# Patient Record
Sex: Male | Born: 1935 | Race: White | Hispanic: No | Smoking: Never smoker
Health system: Southern US, Community
[De-identification: ages and names within clinical notes are randomized; demographics above are authoritative.]

## PROBLEM LIST (undated history)

## (undated) DIAGNOSIS — K219 Gastro-esophageal reflux disease without esophagitis: Secondary | ICD-10-CM

## (undated) DIAGNOSIS — I639 Cerebral infarction, unspecified: Secondary | ICD-10-CM

## (undated) DIAGNOSIS — E785 Hyperlipidemia, unspecified: Secondary | ICD-10-CM

## (undated) DIAGNOSIS — I10 Essential (primary) hypertension: Secondary | ICD-10-CM

## (undated) DIAGNOSIS — I219 Acute myocardial infarction, unspecified: Secondary | ICD-10-CM

## (undated) DIAGNOSIS — F419 Anxiety disorder, unspecified: Secondary | ICD-10-CM

---

## 2008-04-16 ENCOUNTER — Emergency Department (HOSPITAL_BASED_OUTPATIENT_CLINIC_OR_DEPARTMENT_OTHER): Admission: EM | Admit: 2008-04-16 | Discharge: 2008-04-16 | Payer: Self-pay | Admitting: Emergency Medicine

## 2008-04-27 ENCOUNTER — Emergency Department (HOSPITAL_BASED_OUTPATIENT_CLINIC_OR_DEPARTMENT_OTHER): Admission: EM | Admit: 2008-04-27 | Discharge: 2008-04-27 | Payer: Self-pay | Admitting: Emergency Medicine

## 2008-09-24 ENCOUNTER — Emergency Department (HOSPITAL_BASED_OUTPATIENT_CLINIC_OR_DEPARTMENT_OTHER): Admission: EM | Admit: 2008-09-24 | Discharge: 2008-09-24 | Payer: Self-pay | Admitting: Emergency Medicine

## 2009-12-03 ENCOUNTER — Emergency Department (HOSPITAL_BASED_OUTPATIENT_CLINIC_OR_DEPARTMENT_OTHER): Admission: EM | Admit: 2009-12-03 | Discharge: 2009-12-03 | Payer: Self-pay | Admitting: Emergency Medicine

## 2009-12-03 ENCOUNTER — Ambulatory Visit: Payer: Self-pay | Admitting: Diagnostic Radiology

## 2009-12-04 ENCOUNTER — Emergency Department (HOSPITAL_BASED_OUTPATIENT_CLINIC_OR_DEPARTMENT_OTHER): Admission: EM | Admit: 2009-12-04 | Discharge: 2009-12-04 | Payer: Self-pay | Admitting: Emergency Medicine

## 2009-12-11 ENCOUNTER — Emergency Department (HOSPITAL_BASED_OUTPATIENT_CLINIC_OR_DEPARTMENT_OTHER): Admission: EM | Admit: 2009-12-11 | Discharge: 2009-12-11 | Payer: Self-pay | Admitting: Emergency Medicine

## 2010-01-12 ENCOUNTER — Emergency Department (HOSPITAL_BASED_OUTPATIENT_CLINIC_OR_DEPARTMENT_OTHER)
Admission: EM | Admit: 2010-01-12 | Discharge: 2010-01-12 | Payer: Self-pay | Source: Home / Self Care | Admitting: Emergency Medicine

## 2010-05-13 ENCOUNTER — Emergency Department (HOSPITAL_BASED_OUTPATIENT_CLINIC_OR_DEPARTMENT_OTHER): Admission: EM | Admit: 2010-05-13 | Discharge: 2010-05-13 | Payer: Self-pay | Admitting: Emergency Medicine

## 2010-05-13 ENCOUNTER — Ambulatory Visit: Payer: Self-pay | Admitting: Diagnostic Radiology

## 2010-05-20 ENCOUNTER — Emergency Department (HOSPITAL_BASED_OUTPATIENT_CLINIC_OR_DEPARTMENT_OTHER): Admission: EM | Admit: 2010-05-20 | Discharge: 2010-05-20 | Payer: Self-pay | Admitting: Emergency Medicine

## 2011-04-05 ENCOUNTER — Encounter: Payer: Self-pay | Admitting: *Deleted

## 2011-04-05 ENCOUNTER — Emergency Department (INDEPENDENT_AMBULATORY_CARE_PROVIDER_SITE_OTHER): Payer: Medicare Other

## 2011-04-05 ENCOUNTER — Emergency Department (HOSPITAL_BASED_OUTPATIENT_CLINIC_OR_DEPARTMENT_OTHER)
Admission: EM | Admit: 2011-04-05 | Discharge: 2011-04-05 | Disposition: A | Discharge status: Home / Self Care | Payer: Medicare Other | Attending: Emergency Medicine | Admitting: Emergency Medicine

## 2011-04-05 DIAGNOSIS — S42143A Displaced fracture of glenoid cavity of scapula, unspecified shoulder, initial encounter for closed fracture: Secondary | ICD-10-CM

## 2011-04-05 DIAGNOSIS — W050XXA Fall from non-moving wheelchair, initial encounter: Secondary | ICD-10-CM | POA: Insufficient documentation

## 2011-04-05 DIAGNOSIS — W19XXXA Unspecified fall, initial encounter: Secondary | ICD-10-CM

## 2011-04-05 DIAGNOSIS — Z8679 Personal history of other diseases of the circulatory system: Secondary | ICD-10-CM | POA: Insufficient documentation

## 2011-04-05 DIAGNOSIS — K219 Gastro-esophageal reflux disease without esophagitis: Secondary | ICD-10-CM | POA: Insufficient documentation

## 2011-04-05 DIAGNOSIS — I252 Old myocardial infarction: Secondary | ICD-10-CM | POA: Insufficient documentation

## 2011-04-05 DIAGNOSIS — F411 Generalized anxiety disorder: Secondary | ICD-10-CM | POA: Insufficient documentation

## 2011-04-05 HISTORY — DX: Cerebral infarction, unspecified: I63.9

## 2011-04-05 HISTORY — DX: Gastro-esophageal reflux disease without esophagitis: K21.9

## 2011-04-05 HISTORY — DX: Anxiety disorder, unspecified: F41.9

## 2011-04-05 HISTORY — DX: Acute myocardial infarction, unspecified: I21.9

## 2011-04-05 MED ORDER — HYDROCODONE-ACETAMINOPHEN 5-325 MG PO TABS
1.0000 | ORAL_TABLET | Freq: Once | ORAL | Status: AC
  Administered 2011-04-05: 1 via ORAL
  Filled 2011-04-05: qty 1

## 2011-04-05 MED ORDER — ACETAMINOPHEN 325 MG PO TABS: 650.0000 mg | ORAL_TABLET | Freq: Four times a day (QID) | ORAL | Status: DC | PRN

## 2011-04-05 MED ORDER — HYDROCODONE-ACETAMINOPHEN 5-325 MG PO TABS: 1.0000 | ORAL_TABLET | Freq: Four times a day (QID) | ORAL | Status: DC | PRN

## 2011-04-05 NOTE — ED Notes (Signed)
EMS reports patient slipped out of bed last night and landed on his right shoulder.  Pain last night minimal, increasing through out the day.  Pain with movement.  No LOC.

## 2011-04-07 MED ORDER — ACETAMINOPHEN 325 MG PO TABS: 650.0000 mg | ORAL_TABLET | Freq: Four times a day (QID) | ORAL | Status: AC | PRN

## 2011-04-07 MED ORDER — HYDROCODONE-ACETAMINOPHEN 5-325 MG PO TABS: 2.0000 | ORAL_TABLET | Freq: Four times a day (QID) | ORAL | Status: AC | PRN

## 2011-04-07 MED ORDER — ACETAMINOPHEN 325 MG PO TABS: 650.0000 mg | ORAL_TABLET | Freq: Four times a day (QID) | ORAL | Status: DC | PRN

## 2011-04-07 MED ORDER — HYDROCODONE-ACETAMINOPHEN 5-325 MG PO TABS: 1.0000 | ORAL_TABLET | Freq: Four times a day (QID) | ORAL | Status: AC | PRN

## 2011-04-09 NOTE — ED Provider Notes (Signed)
History     CSN: [id number] Arrival date & time: 04/05/2011  3:29 PM  Chief Complaint  Patient presents with  . Fall   HPI Comments: This was a purely mechanical fall.  PAtient did not want to see a doctor yesterday but his pain persisted today and he changed his mind.  Patient is a 77 y.o. male presenting with fall. The history is provided by the patient, the nursing home and a relative. No language interpreter was used.  Fall The accident occurred 12 to 24 hours ago. Incident: while transferring from wheelchair to bed. He fell from a height of 1 to 2 ft. He landed on carpet. There was no blood loss. The point of impact was the right shoulder. The pain is present in the right shoulder. The pain is at a severity of 1/10. He was ambulatory at the scene. There was no entrapment after the fall. There was no drug use involved in the accident. There was no alcohol use involved in the accident. The symptoms are aggravated by use of the injured limb and pressure on the injury. He has tried acetaminophen for the symptoms. The treatment provided no relief.    Past Medical History  Diagnosis Date  . Stroke   . Anxiety   . GERD (gastroesophageal reflux disease)   . MI (myocardial infarction)     History reviewed. No pertinent past surgical history.  History reviewed. No pertinent family history.  History  Substance Use Topics  . Smoking status: Never Smoker   . Smokeless tobacco: Not on file  . Alcohol Use: No      Review of Systems  Constitutional: Negative.   HENT: Negative.   Eyes: Negative.   Respiratory: Negative.   Cardiovascular: Negative.   Genitourinary: Negative.   Musculoskeletal:       Right shoulder pain  Neurological: Negative.   Hematological: Negative.   Psychiatric/Behavioral: Negative.   All other systems reviewed and are negative.    Physical Exam  BP 101/50  Pulse 82  Temp(Src) 100.1 F (37.8 C) (Oral)  Resp 18  SpO2 100%  Physical Exam  Nursing  note and vitals reviewed. Constitutional: He is oriented to person, place, and time. He appears well-developed and well-nourished. No distress.  HENT:  Head: Normocephalic and atraumatic.  Eyes: Conjunctivae and EOM are normal. Pupils are equal, round, and reactive to light.  Neck: Normal range of motion.  Cardiovascular: Normal rate, regular rhythm and normal heart sounds.  Exam reveals no gallop and no friction rub.   No murmur heard. Pulmonary/Chest: Effort normal and breath sounds normal. No respiratory distress. He has no wheezes. He has no rales.  Abdominal: Soft. He exhibits no distension. There is no tenderness. There is no rebound and no guarding.  Musculoskeletal: He exhibits edema and tenderness.       Right shoulder: He exhibits decreased range of motion, tenderness, bony tenderness and swelling.       Ecchymosis overlying anterior portion of the shoulder  Neurological: He is alert and oriented to person, place, and time. No cranial nerve deficit. He exhibits normal muscle tone. Coordination normal.  Skin: Skin is warm and dry.  Psychiatric: He has a normal mood and affect.    ED Course  Procedures  MDM Patient had a purely mechanical fall and was hemodynamically stable here.  He denied pain medicine and film was performed.  There was evidence of glenoid fracture and ortho was consulted.  We discussed plan to place patient in  sling, provide pain control, and advise him to follow-up on outpatient basis.  Patient family was a bedside.  Dr. Cornelius Moras also gave precaution that patient should be monitored for shortness of breath as this could signal a pulmonary contusion associated with the injury.  Family stated understanding and patient was discharged home in good condition in sling with pain meds, precautions, and instructions to be NWB on the RUE and to follow-up with orthopedics.  Family was concerned as patient was scheduled for R CEA on Monday and I instructed them to contact the on  call doctor for their neurosurgeon to notify them of today's events and to see whether they would like to reschedule surgery.  A: 77 yo M with fracture of the R glenoid.  P: As per above.Cyndra Numbers, MD 04/09/11 [phone]

## 2011-05-29 LAB — CBC
HCT: 35 — ABNORMAL LOW
MCV: 91.8
Platelets: 208
RBC: 3.81 — ABNORMAL LOW
RDW: 14

## 2011-05-29 LAB — DIFFERENTIAL
Eosinophils Relative: 2
Lymphocytes Relative: 18
Lymphs Abs: 1.3
Neutro Abs: 4.6

## 2011-05-29 LAB — BASIC METABOLIC PANEL
BUN: 30 — ABNORMAL HIGH
Calcium: 8.6
Potassium: 4.1
Sodium: 142

## 2011-05-29 LAB — OCCULT BLOOD X 1 CARD TO LAB, STOOL: Fecal Occult Bld: NEGATIVE

## 2012-11-21 ENCOUNTER — Encounter (HOSPITAL_COMMUNITY): Payer: Self-pay | Admitting: Emergency Medicine

## 2012-11-21 ENCOUNTER — Emergency Department (HOSPITAL_COMMUNITY)
Admission: EM | Admit: 2012-11-21 | Discharge: 2012-11-21 | Disposition: A | Discharge status: Home / Self Care | Payer: Medicare Other | Attending: Emergency Medicine | Admitting: Emergency Medicine

## 2012-11-21 DIAGNOSIS — Z7982 Long term (current) use of aspirin: Secondary | ICD-10-CM | POA: Insufficient documentation

## 2012-11-21 DIAGNOSIS — I252 Old myocardial infarction: Secondary | ICD-10-CM | POA: Insufficient documentation

## 2012-11-21 DIAGNOSIS — S01501A Unspecified open wound of lip, initial encounter: Secondary | ICD-10-CM | POA: Insufficient documentation

## 2012-11-21 DIAGNOSIS — Z7902 Long term (current) use of antithrombotics/antiplatelets: Secondary | ICD-10-CM | POA: Insufficient documentation

## 2012-11-21 DIAGNOSIS — Z8673 Personal history of transient ischemic attack (TIA), and cerebral infarction without residual deficits: Secondary | ICD-10-CM | POA: Insufficient documentation

## 2012-11-21 DIAGNOSIS — W050XXA Fall from non-moving wheelchair, initial encounter: Secondary | ICD-10-CM | POA: Insufficient documentation

## 2012-11-21 DIAGNOSIS — S0181XA Laceration without foreign body of other part of head, initial encounter: Secondary | ICD-10-CM

## 2012-11-21 DIAGNOSIS — Y939 Activity, unspecified: Secondary | ICD-10-CM | POA: Insufficient documentation

## 2012-11-21 DIAGNOSIS — F411 Generalized anxiety disorder: Secondary | ICD-10-CM | POA: Insufficient documentation

## 2012-11-21 DIAGNOSIS — Y9210 Unspecified residential institution as the place of occurrence of the external cause: Secondary | ICD-10-CM | POA: Insufficient documentation

## 2012-11-21 DIAGNOSIS — K219 Gastro-esophageal reflux disease without esophagitis: Secondary | ICD-10-CM | POA: Insufficient documentation

## 2012-11-21 DIAGNOSIS — Z79899 Other long term (current) drug therapy: Secondary | ICD-10-CM | POA: Insufficient documentation

## 2012-11-21 DIAGNOSIS — W1809XA Striking against other object with subsequent fall, initial encounter: Secondary | ICD-10-CM | POA: Insufficient documentation

## 2012-11-21 NOTE — ED Notes (Addendum)
Pt from Nursing home, c/o fall out of wheel chair. Pt states wheel chair moved. Skin tear to left forearm, laceration to bottom lip. Per EMS Pt had nosebleed on scene, has resolved. No LOC or signs of distress

## 2012-11-21 NOTE — ED Provider Notes (Signed)
LACERATION REPAIR Date/Time: 11/21/2012 9:25 AM Performed by: Dierdre Forth Authorized by: Dierdre Forth Consent: Verbal consent obtained. Risks and benefits: risks, benefits and alternatives were discussed Consent given by: patient Patient understanding: patient states understanding of the procedure being performed Patient consent: the patient's understanding of the procedure matches consent given Procedure consent: procedure consent matches procedure scheduled Relevant documents: relevant documents present and verified Site marked: the operative site was marked Required items: required blood products, implants, devices, and special equipment available Patient identity confirmed: verbally with patient and arm band Time out: Immediately prior to procedure a "time out" was called to verify the correct patient, procedure, equipment, support staff and site/side marked as required. Body area: mouth Location details: lower lip, interior Laceration length: 5 cm Foreign bodies: no foreign bodies Tendon involvement: none Nerve involvement: none Vascular damage: no Anesthesia: local infiltration Local anesthetic: lidocaine 2% without epinephrine Anesthetic total: 2.5 ml Patient sedated: no Preparation: Patient was prepped and draped in the usual sterile fashion. Irrigation solution: saline Irrigation method: syringe Amount of cleaning: standard Debridement: none Degree of undermining: none Mucous membrane closure: 4-0 Chromic gut Number of sutures: 6 Technique: simple Approximation: close Approximation difficulty: complex Patient tolerance: Patient tolerated the procedure well with no immediate complications.  LACERATION REPAIR Date/Time: 11/21/2012 9:26 AM Performed by: Dierdre Forth Authorized by: Dierdre Forth Consent: Verbal consent obtained. Risks and benefits: risks, benefits and alternatives were discussed Consent given by: patient Patient  understanding: patient states understanding of the procedure being performed Patient consent: the patient's understanding of the procedure matches consent given Procedure consent: procedure consent matches procedure scheduled Relevant documents: relevant documents present and verified Site marked: the operative site was marked Required items: required blood products, implants, devices, and special equipment available Patient identity confirmed: verbally with patient Time out: Immediately prior to procedure a "time out" was called to verify the correct patient, procedure, equipment, support staff and site/side marked as required. Body area: head/neck Location details: lower lip Full thickness lip laceration: yes Vermillion border involved: yes Lip laceration height: vermillion only Laceration length: 4.5 cm Tendon involvement: none Nerve involvement: none Vascular damage: no Anesthesia: local infiltration Local anesthetic: lidocaine 2% without epinephrine Anesthetic total: 2.5 ml Patient sedated: no Irrigation solution: saline Irrigation method: syringe Amount of cleaning: standard Debridement: none Degree of undermining: none Skin closure: 5-0 Prolene Number of sutures: 8 Technique: running Approximation: close Approximation difficulty: simple Lip approximation: vermillion border well aligned Dressing: 4x4 sterile gauze Patient tolerance: Patient tolerated the procedure well with no immediate complications.   Bobby Client [name], PA-C 11/21/12 [phone]

## 2012-11-21 NOTE — ED Provider Notes (Addendum)
History     CSN: [id number]  Arrival date & time 11/21/12  7829   First MD Initiated Contact with Patient 11/21/12 [phone]      Chief Complaint  Patient presents with  . Fall  . Lip Laceration    (Consider location/radiation/quality/duration/timing/severity/associated sxs/prior treatment) Patient is a 79 y.o. male presenting with fall. The history is provided by the patient.  Fall   patient here before for nursing home of wheelchair and striking the bottom of his lip. No loss of consciousness. Denies any head or neck pain. Does note moderate bleeding to his lower lip. Denies any chest abdomen pain. No hip or lower extremity pain. He also sustained a skin tear to his left forearm which is treated with a bandage. Symptoms have been persistent and were treated with direct pressure to his lower lip. Denies any jaw pain at this time. No trouble including his teeth  Past Medical History  Diagnosis Date  . Stroke   . Anxiety   . GERD (gastroesophageal reflux disease)   . MI (myocardial infarction)     History reviewed. No pertinent past surgical history.  History reviewed. No pertinent family history.  History  Substance Use Topics  . Smoking status: Never Smoker   . Smokeless tobacco: Not on file  . Alcohol Use: No      Review of Systems  All other systems reviewed and are negative.    Allergies  Review of patient's allergies indicates no known allergies.  Home Medications   Current Outpatient Rx  Name  Route  Sig  Dispense  Refill  . acetaminophen (TYLENOL) 325 MG tablet   Oral   Take 2 tablets (650 mg total) by mouth every 6 (six) hours as needed.   30 tablet   0   . acetaminophen (TYLENOL) 500 MG tablet   Oral   Take 1,000 mg by mouth every 6 (six) hours as needed. Pain           . aspirin 81 MG tablet   Oral   Take 81 mg by mouth every other day.          Marland Kitchen atorvastatin (LIPITOR) 40 MG tablet   Oral   Take 40 mg by mouth daily.           .  carvedilol (COREG) 3.125 MG tablet   Oral   Take 3.125 mg by mouth 2 (two) times daily with a meal.           . Cholecalciferol (VITAMIN D) 2000 UNITS CAPS   Oral   Take 1 capsule by mouth daily.           . clopidogrel (PLAVIX) 75 MG tablet   Oral   Take 75 mg by mouth daily.           Marland Kitchen lisinopril (PRINIVIL,ZESTRIL) 2.5 MG tablet   Oral   Take 2.5 mg by mouth daily.           . nitroGLYCERIN (NITROSTAT) 0.4 MG SL tablet   Sublingual   Place 0.4 mg under the tongue every 5 (five) minutes as needed.           . pantoprazole (PROTONIX) 40 MG tablet   Oral   Take 40 mg by mouth daily.           . Tamsulosin HCl (FLOMAX) 0.4 MG CAPS   Oral   Take 0.4 mg by mouth daily.  BP 137/91  Pulse 75  Temp(Src) 97.5 F (36.4 C) (Axillary)  SpO2 98%  Physical Exam  Nursing note and vitals reviewed. Constitutional: He is oriented to person, place, and time. He appears well-developed and well-nourished.  Non-toxic appearance. No distress.  HENT:  Head: Normocephalic and atraumatic.  Mouth/Throat:    No mal occlusion  Eyes: Conjunctivae, EOM and lids are normal. Pupils are equal, round, and reactive to light.  Neck: Normal range of motion. Neck supple. No spinous process tenderness and no muscular tenderness present. No tracheal deviation present. No mass present.  Cardiovascular: Normal rate, regular rhythm and normal heart sounds.  Exam reveals no gallop.   No murmur heard. Pulmonary/Chest: Effort normal and breath sounds normal. No stridor. No respiratory distress. He has no decreased breath sounds. He has no wheezes. He has no rhonchi. He has no rales.  Abdominal: Soft. Normal appearance and bowel sounds are normal. He exhibits no distension. There is no tenderness. There is no rebound and no CVA tenderness.  Musculoskeletal: Normal range of motion. He exhibits no edema and no tenderness.       Arms: Neurological: He is alert and oriented to person,  place, and time. He has normal strength. No cranial nerve deficit or sensory deficit. GCS eye subscore is 4. GCS verbal subscore is 5. GCS motor subscore is 6.  Skin: Skin is warm and dry. No abrasion and no rash noted.  Psychiatric: He has a normal mood and affect. His speech is normal and behavior is normal.    ED Course  Procedures (including critical care time)  Labs Reviewed - No data to display No results found.   No diagnosis found.    MDM  Laceration repaired by PA--no indication for imaging        Toy Baker, MD 11/21/12 1610  Toy Baker, MD 11/21/12 [phone]

## 2012-11-23 NOTE — ED Provider Notes (Signed)
Medical screening examination/treatment/procedure(s) were conducted as a shared visit with non-physician practitioner(s) and myself.  I personally evaluated the patient during the encounter  Toy Baker, MD 11/23/12 [phone]

## 2013-03-11 ENCOUNTER — Emergency Department (HOSPITAL_COMMUNITY)
Admission: EM | Admit: 2013-03-11 | Discharge: 2013-03-11 | Disposition: A | Discharge status: Home / Self Care | Payer: Medicare Other | Attending: Emergency Medicine | Admitting: Emergency Medicine

## 2013-03-11 ENCOUNTER — Encounter (HOSPITAL_COMMUNITY): Payer: Self-pay | Admitting: Emergency Medicine

## 2013-03-11 DIAGNOSIS — S41111A Laceration without foreign body of right upper arm, initial encounter: Secondary | ICD-10-CM

## 2013-03-11 DIAGNOSIS — Z8673 Personal history of transient ischemic attack (TIA), and cerebral infarction without residual deficits: Secondary | ICD-10-CM | POA: Insufficient documentation

## 2013-03-11 DIAGNOSIS — K219 Gastro-esophageal reflux disease without esophagitis: Secondary | ICD-10-CM | POA: Insufficient documentation

## 2013-03-11 DIAGNOSIS — Z7902 Long term (current) use of antithrombotics/antiplatelets: Secondary | ICD-10-CM | POA: Insufficient documentation

## 2013-03-11 DIAGNOSIS — Y9210 Unspecified residential institution as the place of occurrence of the external cause: Secondary | ICD-10-CM | POA: Insufficient documentation

## 2013-03-11 DIAGNOSIS — S41109A Unspecified open wound of unspecified upper arm, initial encounter: Secondary | ICD-10-CM | POA: Insufficient documentation

## 2013-03-11 DIAGNOSIS — F0390 Unspecified dementia without behavioral disturbance: Secondary | ICD-10-CM | POA: Insufficient documentation

## 2013-03-11 DIAGNOSIS — Z79899 Other long term (current) drug therapy: Secondary | ICD-10-CM | POA: Insufficient documentation

## 2013-03-11 DIAGNOSIS — W010XXA Fall on same level from slipping, tripping and stumbling without subsequent striking against object, initial encounter: Secondary | ICD-10-CM | POA: Insufficient documentation

## 2013-03-11 DIAGNOSIS — Y9389 Activity, other specified: Secondary | ICD-10-CM | POA: Insufficient documentation

## 2013-03-11 DIAGNOSIS — Z23 Encounter for immunization: Secondary | ICD-10-CM | POA: Insufficient documentation

## 2013-03-11 DIAGNOSIS — I252 Old myocardial infarction: Secondary | ICD-10-CM | POA: Insufficient documentation

## 2013-03-11 DIAGNOSIS — Z8659 Personal history of other mental and behavioral disorders: Secondary | ICD-10-CM | POA: Insufficient documentation

## 2013-03-11 MED ORDER — TETANUS-DIPHTH-ACELL PERTUSSIS 5-2.5-18.5 LF-MCG/0.5 IM SUSP
0.5000 mL | Freq: Once | INTRAMUSCULAR | Status: AC
  Administered 2013-03-11: 0.5 mL via INTRAMUSCULAR
  Filled 2013-03-11: qty 0.5

## 2013-03-11 NOTE — ED Notes (Signed)
ptar called to transport pt.

## 2013-03-11 NOTE — ED Notes (Signed)
Report given to Nurse on duty at [facility]

## 2013-03-11 NOTE — ED Notes (Signed)
Per EMS: From [facility]. pt had unwitnessed fall, found on floor. Did not hit head, two skin tears to right arm. Denies pain and any other complaints.

## 2013-03-11 NOTE — ED Provider Notes (Signed)
History    CSN: [id number] Arrival date & time 03/11/13  1847  First MD Initiated Contact with Patient 03/11/13 1858     Chief Complaint  Patient presents with  . Fall   (Consider location/radiation/quality/duration/timing/severity/associated sxs/prior Treatment) Patient is a 79 y.o. male presenting with fall. The history is provided by the patient and the EMS personnel. The history is limited by the condition of the patient.  Fall Pertinent negatives include no chest pain, no abdominal pain and no headaches.  pt s[facility]p fall at [facility]. States trip and fall, denies faintness or loc. Denies head injury or headache. No neck or back pain. Skin tears right upper arm, pt unsure of tetanus.  pts mental status described as being c/w baseline. Hx dementia -- level 5 caveat.     Past Medical History  Diagnosis Date  . Stroke   . Anxiety   . GERD (gastroesophageal reflux disease)   . MI (myocardial infarction)    History reviewed. No pertinent past surgical history. No family history on file. History  Substance Use Topics  . Smoking status: Never Smoker   . Smokeless tobacco: Not on file  . Alcohol Use: No    Review of Systems  Unable to perform ROS: Dementia  Constitutional: Negative for fever.  Cardiovascular: Negative for chest pain.  Gastrointestinal: Negative for abdominal pain.  Neurological: Negative for headaches.  level 5 caveat    Allergies  Review of patient's allergies indicates no known allergies.  Home Medications   Current Outpatient Rx  Name  Route  Sig  Dispense  Refill  . acetaminophen (TYLENOL) 500 MG tablet   Oral   Take 1,000 mg by mouth every 6 (six) hours as needed. Pain           . aluminum-magnesium hydroxide 200-200 MG/5ML suspension   Oral   Take 30 mLs by mouth every 6 (six) hours as needed for indigestion.         . ARTIFICIAL TEAR OP   Ophthalmic   Apply 1 drop to eye 4 (four) times daily as needed. For dry eyes         .  atorvastatin (LIPITOR) 40 MG tablet   Oral   Take 40 mg by mouth daily.           . carvedilol (COREG) 3.125 MG tablet   Oral   Take 3.125 mg by mouth 2 (two) times daily with a meal.           . clopidogrel (PLAVIX) 75 MG tablet   Oral   Take 75 mg by mouth daily.           . GuaiFENesin (IOPHEN-NR PO)   Oral   Take 10 mLs by mouth every 6 (six) hours as needed. For cough         . loperamide (IMODIUM) 2 MG capsule   Oral   Take 2 mg by mouth 4 (four) times daily as needed for diarrhea or loose stools.         . Magnesium Hydroxide (MILK OF MAGNESIA PO)   Oral   Take 30 mLs by mouth at bedtime as needed. For constipation         . Nepafenac (ILEVRO) 0.3 % SUSP   Ophthalmic   Apply 1 drop to eye daily.         . nitroGLYCERIN (NITROSTAT) 0.4 MG SL tablet   Sublingual   Place 0.4 mg under the tongue every 5 (five) minutes  as needed.           . pantoprazole (PROTONIX) 40 MG tablet   Oral   Take 40 mg by mouth daily.           Marland Kitchen PRESCRIPTION MEDICATION   Left Eye   Place 1 drop into the left eye 3 (three) times daily. Uses Duzerol eye drops         . Tamsulosin HCl (FLOMAX) 0.4 MG CAPS   Oral   Take 0.4 mg by mouth daily.           BP 120/56  Pulse 83  Temp(Src) 98.4 F (36.9 C) (Oral)  Resp 21  SpO2 99% Physical Exam  Nursing note and vitals reviewed. Constitutional: He appears well-developed and well-nourished. No distress.  HENT:  Head: Atraumatic.  No facial or scalp sts or tenderness.   Eyes: Pupils are equal, round, and reactive to light.  Neck: Normal range of motion. Neck supple. No tracheal deviation present.  Cardiovascular: Normal rate.   Pulmonary/Chest: Effort normal. No accessory muscle usage. No respiratory distress. He exhibits no tenderness.  Abdominal: Soft. He exhibits no distension. There is no tenderness.  Musculoskeletal: Normal range of motion. He exhibits no edema.  Superficial small skin tears to right upper  arm. Good rom bil extremities without pain or focal bony tenderness. Distal pulses palp. CTLS spine, non tender, aligned, no step off.   Neurological: He is alert.  Alert, smiling, content. Cooperative. Mental status described at baseline. Motor intact bil.   Skin: Skin is warm and dry.  Psychiatric: He has a normal mood and affect.    ED Course  Procedures (including critical care time)   MDM  Tetanus unknown. Tet im.  Spine nt.   Superficial skin tear to right upper arm, cleaned. Sterile dressing.  No focal bony tenderness.  Pt denies pain. Mental status at baseline.  Pt appears stable for d/c.     Suzi Roots, MD 03/11/13 [phone]

## 2013-03-11 NOTE — ED Notes (Signed)
[id number]<WG> Expected date:<BR> Expected time:<BR> Means of arrival:<BR> Comments:<BR> ems- 79 yo F fall

## 2013-05-31 ENCOUNTER — Emergency Department (HOSPITAL_COMMUNITY): Payer: Medicare Other

## 2013-05-31 ENCOUNTER — Emergency Department (HOSPITAL_COMMUNITY)
Admission: EM | Admit: 2013-05-31 | Discharge: 2013-05-31 | Disposition: A | Discharge status: Home / Self Care | Payer: Medicare Other | Attending: Emergency Medicine | Admitting: Emergency Medicine

## 2013-05-31 ENCOUNTER — Encounter (HOSPITAL_COMMUNITY): Payer: Self-pay

## 2013-05-31 DIAGNOSIS — Z8639 Personal history of other endocrine, nutritional and metabolic disease: Secondary | ICD-10-CM | POA: Insufficient documentation

## 2013-05-31 DIAGNOSIS — Z79899 Other long term (current) drug therapy: Secondary | ICD-10-CM | POA: Insufficient documentation

## 2013-05-31 DIAGNOSIS — Z7902 Long term (current) use of antithrombotics/antiplatelets: Secondary | ICD-10-CM | POA: Insufficient documentation

## 2013-05-31 DIAGNOSIS — Z8673 Personal history of transient ischemic attack (TIA), and cerebral infarction without residual deficits: Secondary | ICD-10-CM | POA: Insufficient documentation

## 2013-05-31 DIAGNOSIS — S0990XA Unspecified injury of head, initial encounter: Secondary | ICD-10-CM | POA: Insufficient documentation

## 2013-05-31 DIAGNOSIS — Y9210 Unspecified residential institution as the place of occurrence of the external cause: Secondary | ICD-10-CM | POA: Insufficient documentation

## 2013-05-31 DIAGNOSIS — R296 Repeated falls: Secondary | ICD-10-CM | POA: Insufficient documentation

## 2013-05-31 DIAGNOSIS — Z8659 Personal history of other mental and behavioral disorders: Secondary | ICD-10-CM | POA: Insufficient documentation

## 2013-05-31 DIAGNOSIS — I252 Old myocardial infarction: Secondary | ICD-10-CM | POA: Insufficient documentation

## 2013-05-31 DIAGNOSIS — I10 Essential (primary) hypertension: Secondary | ICD-10-CM | POA: Insufficient documentation

## 2013-05-31 DIAGNOSIS — W1809XA Striking against other object with subsequent fall, initial encounter: Secondary | ICD-10-CM | POA: Insufficient documentation

## 2013-05-31 DIAGNOSIS — K219 Gastro-esophageal reflux disease without esophagitis: Secondary | ICD-10-CM | POA: Insufficient documentation

## 2013-05-31 DIAGNOSIS — W19XXXA Unspecified fall, initial encounter: Secondary | ICD-10-CM

## 2013-05-31 DIAGNOSIS — Y9389 Activity, other specified: Secondary | ICD-10-CM | POA: Insufficient documentation

## 2013-05-31 DIAGNOSIS — Z862 Personal history of diseases of the blood and blood-forming organs and certain disorders involving the immune mechanism: Secondary | ICD-10-CM | POA: Insufficient documentation

## 2013-05-31 DIAGNOSIS — F0390 Unspecified dementia without behavioral disturbance: Secondary | ICD-10-CM | POA: Insufficient documentation

## 2013-05-31 HISTORY — DX: Hyperlipidemia, unspecified: E78.5

## 2013-05-31 HISTORY — DX: Essential (primary) hypertension: I10

## 2013-05-31 NOTE — ED Notes (Signed)
Son and Dr. Judd Lien at the bedside at this time.

## 2013-05-31 NOTE — ED Notes (Signed)
Per [facility], pt from [facility] for fall this morning. Takes plavix and fell when transferring from commode to other chair. Denies any LOC but did hit his head. Pt alert to self which is not normal. Pt is own POA and also DNR. LBBB on monitor. VSS

## 2013-05-31 NOTE — ED Provider Notes (Addendum)
CSN: [id number]     Arrival date & time 05/31/13  1015 History   First MD Initiated Contact with Patient 05/31/13 1028     Chief Complaint  Patient presents with  . Fall   (Consider location/radiation/quality/duration/timing/severity/associated sxs/prior Treatment) HPI Comments: Patient is a 79 year old male past medical history of dementia. Is a resident of house extended care facility. He was sent here after a fall that occurred this morning while transferring from the toilet to wheelchair. Patient has no complaints and states that nothing hurts. The staff at the [facility] reports that his head. As the patient is on Plavix and has baseline confusion he was sent here for further evaluation.  Patient is a 79 y.o. male presenting with fall. The history is provided by the patient.  Fall This is a new problem. The current episode started less than 1 hour ago. The problem occurs constantly. The problem has not changed since onset.Associated symptoms comments: None. Nothing aggravates the symptoms. Nothing relieves the symptoms. He has tried nothing for the symptoms. The treatment provided no relief.    Past Medical History  Diagnosis Date  . Stroke   . Anxiety   . GERD (gastroesophageal reflux disease)   . MI (myocardial infarction)   . Hypertension   . Hyperlipidemia    History reviewed. No pertinent past surgical history. History reviewed. No pertinent family history. History  Substance Use Topics  . Smoking status: Never Smoker   . Smokeless tobacco: Not on file  . Alcohol Use: No    Review of Systems  All other systems reviewed and are negative.    Allergies  Review of patient's allergies indicates no known allergies.  Home Medications   Current Outpatient Rx  Name  Route  Sig  Dispense  Refill  . acetaminophen (TYLENOL) 500 MG tablet   Oral   Take 1,000 mg by mouth every 6 (six) hours as needed. Pain           . aluminum-magnesium hydroxide 200-200 MG/5ML suspension  Oral   Take 30 mLs by mouth every 6 (six) hours as needed for indigestion.         . ARTIFICIAL TEAR OP   Ophthalmic   Apply 1 drop to eye 4 (four) times daily as needed. For dry eyes         . carvedilol (COREG) 3.125 MG tablet   Oral   Take 3.125 mg by mouth 2 (two) times daily with a meal.           . clopidogrel (PLAVIX) 75 MG tablet   Oral   Take 75 mg by mouth daily.           Marland Kitchen loperamide (IMODIUM) 2 MG capsule   Oral   Take 2 mg by mouth 4 (four) times daily as needed for diarrhea or loose stools.         . Magnesium Hydroxide (MILK OF MAGNESIA PO)   Oral   Take 30 mLs by mouth at bedtime as needed. For constipation         . nitroGLYCERIN (NITROSTAT) 0.4 MG SL tablet   Sublingual   Place 0.4 mg under the tongue every 5 (five) minutes as needed.           . pantoprazole (PROTONIX) 40 MG tablet   Oral   Take 40 mg by mouth daily.           Marland Kitchen PRESCRIPTION MEDICATION   Left Eye   Place 1 drop  into the left eye 3 (three) times daily. Uses Duzerol eye drops         . Tamsulosin HCl (FLOMAX) 0.4 MG CAPS   Oral   Take 0.4 mg by mouth daily.           BP 110/63  Pulse 70  Temp(Src) 97.6 F (36.4 C) (Oral)  Resp 20  SpO2 97% Physical Exam  Nursing note and vitals reviewed. Constitutional: He appears well-developed and well-nourished. No distress.  HENT:  Head: Normocephalic and atraumatic.  Mouth/Throat: Oropharynx is clear and moist.  Eyes: EOM are normal. Pupils are equal, round, and reactive to light.  Neck: Normal range of motion. Neck supple.  Cardiovascular: Normal rate, regular rhythm and normal heart sounds.   No murmur heard. Pulmonary/Chest: Effort normal and breath sounds normal. No respiratory distress. He has no wheezes.  Abdominal: Soft. Bowel sounds are normal.  Neurological: He is alert. No cranial nerve deficit. He exhibits normal muscle tone. Coordination normal.  Patient is alert and oriented to person and situation  however he is unsure of the month and year. He is otherwise smiling and pleasant and appropriate.  Skin: He is not diaphoretic.    ED Course  Procedures (including critical care time) Labs Review Labs Reviewed - No data to display Imaging Review No results found.   Date: 05/31/2013  Rate: 81  Rhythm: normal sinus rhythm with pac's  QRS Axis: left  Intervals: normal  ST/T Wave abnormalities: nonspecific T wave changes  Conduction Disutrbances:left bundle branch block  Narrative Interpretation:   Old EKG Reviewed: none available    MDM  No diagnosis found. Patient was sent here from the extended care facility for evaluation after a fall. The nurses there stated he hit his head the patient is on Plavix. There was some question as to whether or not he was more confused. According to the son the patient is at his baseline. After discussion with him, the decision was made to obtain a CT of the head just to rule out any intracranial hemorrhage. This was performed and was negative. Patient remained stable and is at his baseline per her son. I feel as though he is stable for discharge. To return when necessary if he develops any new or bothersome symptoms.    Geoffery Lyons, MD 05/31/13 1326  Geoffery Lyons, MD 05/31/13 [phone]

## 2013-07-07 ENCOUNTER — Emergency Department (HOSPITAL_COMMUNITY): Payer: Medicare Other

## 2013-07-07 ENCOUNTER — Emergency Department (HOSPITAL_COMMUNITY)
Admission: EM | Admit: 2013-07-07 | Discharge: 2013-07-07 | Disposition: A | Discharge status: Skilled Nursing Facility | Payer: Medicare Other | Attending: Emergency Medicine | Admitting: Emergency Medicine

## 2013-07-07 DIAGNOSIS — S0180XA Unspecified open wound of other part of head, initial encounter: Secondary | ICD-10-CM | POA: Insufficient documentation

## 2013-07-07 DIAGNOSIS — Z7902 Long term (current) use of antithrombotics/antiplatelets: Secondary | ICD-10-CM | POA: Insufficient documentation

## 2013-07-07 DIAGNOSIS — Y9389 Activity, other specified: Secondary | ICD-10-CM | POA: Insufficient documentation

## 2013-07-07 DIAGNOSIS — I252 Old myocardial infarction: Secondary | ICD-10-CM | POA: Insufficient documentation

## 2013-07-07 DIAGNOSIS — S0990XA Unspecified injury of head, initial encounter: Secondary | ICD-10-CM

## 2013-07-07 DIAGNOSIS — Y9210 Unspecified residential institution as the place of occurrence of the external cause: Secondary | ICD-10-CM | POA: Insufficient documentation

## 2013-07-07 DIAGNOSIS — I10 Essential (primary) hypertension: Secondary | ICD-10-CM | POA: Insufficient documentation

## 2013-07-07 DIAGNOSIS — Z8673 Personal history of transient ischemic attack (TIA), and cerebral infarction without residual deficits: Secondary | ICD-10-CM | POA: Insufficient documentation

## 2013-07-07 DIAGNOSIS — K219 Gastro-esophageal reflux disease without esophagitis: Secondary | ICD-10-CM | POA: Insufficient documentation

## 2013-07-07 DIAGNOSIS — W050XXA Fall from non-moving wheelchair, initial encounter: Secondary | ICD-10-CM | POA: Insufficient documentation

## 2013-07-07 DIAGNOSIS — Z79899 Other long term (current) drug therapy: Secondary | ICD-10-CM | POA: Insufficient documentation

## 2013-07-07 DIAGNOSIS — Z8659 Personal history of other mental and behavioral disorders: Secondary | ICD-10-CM | POA: Insufficient documentation

## 2013-07-07 DIAGNOSIS — S01111A Laceration without foreign body of right eyelid and periocular area, initial encounter: Secondary | ICD-10-CM

## 2013-07-07 DIAGNOSIS — E785 Hyperlipidemia, unspecified: Secondary | ICD-10-CM | POA: Insufficient documentation

## 2013-07-07 DIAGNOSIS — F0390 Unspecified dementia without behavioral disturbance: Secondary | ICD-10-CM | POA: Insufficient documentation

## 2013-07-07 DIAGNOSIS — Z7982 Long term (current) use of aspirin: Secondary | ICD-10-CM | POA: Insufficient documentation

## 2013-07-07 NOTE — ED Notes (Signed)
PTAR called  

## 2013-07-07 NOTE — ED Notes (Signed)
Suture cart to bedside. 

## 2013-07-07 NOTE — ED Notes (Signed)
Patient transported to CT 

## 2013-07-07 NOTE — ED Notes (Signed)
To ED via [facility] from [facility] c/o fell while transferring from w/c to bed- 3" laceration over right eyebrow. Ecchymotic area over left eye -- old injury per staff. Pleasantly confused. Alert.

## 2013-07-07 NOTE — ED Provider Notes (Signed)
CSN: [id number]     Arrival date & time 07/07/13  1343 History   First MD Initiated Contact with Patient 07/07/13 1346     Chief Complaint  Patient presents with  . Fall  . Head Laceration   (Consider location/radiation/quality/duration/timing/severity/associated sxs/prior Treatment) HPI Comments: Patient is a 79 year old male past medical history of dementia. Is a resident of house extended care facility. He was sent here after a fall that occurred this morning while transferring from the wheelchair to bed. Patient has no complaints and states that nothing hurts. The staff at the [facility] reports that he hit his head. As the patient is on Plavix and has baseline confusion he was sent here for further evaluation.  The history is provided by the patient. No language interpreter was used.    Past Medical History  Diagnosis Date  . Stroke   . Anxiety   . GERD (gastroesophageal reflux disease)   . MI (myocardial infarction)   . Hypertension   . Hyperlipidemia    No past surgical history on file. No family history on file. History  Substance Use Topics  . Smoking status: Never Smoker   . Smokeless tobacco: Not on file  . Alcohol Use: No    Review of Systems  All other systems reviewed and are negative.    Allergies  Review of patient's allergies indicates no known allergies.  Home Medications   Current Outpatient Rx  Name  Route  Sig  Dispense  Refill  . acetaminophen (TYLENOL) 500 MG tablet   Oral   Take 500-1,000 mg by mouth every 4 (four) hours as needed for mild pain or fever. Pain          . alum & mag hydroxide-simeth (MAALOX/MYLANTA) 200-200-20 MG/5ML suspension   Oral   Take 30 mLs by mouth 4 (four) times daily as needed for indigestion or heartburn.         Marland Kitchen aspirin 81 MG chewable tablet   Oral   Chew 81 mg by mouth daily.         . carvedilol (COREG) 3.125 MG tablet   Oral   Take 3.125 mg by mouth 2 (two) times daily with a meal.           .  clopidogrel (PLAVIX) 75 MG tablet   Oral   Take 75 mg by mouth daily.           Marland Kitchen guaiFENesin (ROBITUSSIN) 100 MG/5ML liquid   Oral   Take 200 mg by mouth every 6 (six) hours as needed for cough.         . loperamide (IMODIUM) 2 MG capsule   Oral   Take 2 mg by mouth 4 (four) times daily as needed for diarrhea or loose stools.         . Magnesium Hydroxide (MILK OF MAGNESIA PO)   Oral   Take 30 mLs by mouth at bedtime as needed. For constipation         . nitroGLYCERIN (NITROSTAT) 0.4 MG SL tablet   Sublingual   Place 0.4 mg under the tongue every 5 (five) minutes as needed.           . pantoprazole (PROTONIX) 40 MG tablet   Oral   Take 40 mg by mouth daily.           . Tamsulosin HCl (FLOMAX) 0.4 MG CAPS   Oral   Take 0.4 mg by mouth daily.  BP 120/59  Pulse 64  Temp(Src) 97.6 F (36.4 C) (Oral)  Resp 18  SpO2 97% Physical Exam  Nursing note and vitals reviewed. Constitutional: He is oriented to person, place, and time. He appears well-developed and well-nourished.  HENT:  Head: Normocephalic and atraumatic.  Right Ear: External ear normal.  Left Ear: External ear normal.  Nose: Nose normal.  Mouth/Throat: Oropharynx is clear and moist. No oropharyngeal exudate.  No palpable bony deformity  Eyes: Conjunctivae and EOM are normal. Pupils are equal, round, and reactive to light. Right eye exhibits no discharge. Left eye exhibits no discharge. No scleral icterus.  Neck: Normal range of motion. Neck supple. No JVD present.  Cardiovascular: Normal rate, regular rhythm, normal heart sounds and intact distal pulses.  Exam reveals no gallop and no friction rub.   No murmur heard. Pulmonary/Chest: Effort normal and breath sounds normal. No respiratory distress. He has no wheezes. He has no rales. He exhibits no tenderness.  Abdominal: Soft. Bowel sounds are normal. He exhibits no distension and no mass. There is no tenderness. There is no rebound and no  guarding.  Musculoskeletal: Normal range of motion. He exhibits no edema and no tenderness.  CTLS spine non-tender to palpation, no step offs, or gross abnormality or deformity  Neurological: He is alert and oriented to person, place, and time. He has normal reflexes.  CN 3-12 intact  Skin: Skin is warm and dry.  5 cm laceration to right eyebrow, no obvious foreign bodies  Psychiatric: He has a normal mood and affect. His behavior is normal. Judgment and thought content normal.    ED Course  Procedures (including critical care time)   LACERATION REPAIR Performed by: Roxy Horseman Authorized by: Roxy Horseman Consent: Verbal consent obtained. Risks and benefits: risks, benefits and alternatives were discussed Consent given by: patient Patient identity confirmed: provided demographic data Prepped and Draped in normal sterile fashion Wound explored  Laceration Location: right forehead  Laceration Length: 5 cm  No Foreign Bodies seen or palpated  Anesthesia: local infiltration  Local anesthetic: lidocaine 2% with epinephrine  Anesthetic total: 3 ml  Irrigation method: syringe Amount of cleaning: standard  Skin closure: 5-0 prolene  Number of sutures: 9  Technique: running  Patient tolerance: Patient tolerated the procedure well with no immediate complications.   EKG Interpretation   None       MDM   1. Head injury, initial encounter   2. Eyebrow laceration, right, initial encounter      Patient seen by and discussed with Dr. Wilkie Aye.  Will check CT and cervical c-spine.  Will repair lac.  If imaging is negative, will discharge to home.  Laceration repaired.    CT shows evidence of pleural effusion, but not clinically relevant.  He is afebrile.  His pulse ox is 99%.  No difficulty breathing.  States that he wants to go home.  Discussed this with Dr. Wilkie Aye, who tells me that we can discharge the patient.  Roxy Horseman, PA-C 07/07/13 1642

## 2013-07-07 NOTE — ED Notes (Signed)
Pt placed on continuous pulse oximetry and blood pressure cuff 

## 2013-07-07 NOTE — ED Notes (Signed)
Pt return from CT. MD at bedside

## 2013-07-08 NOTE — ED Provider Notes (Signed)
Medical screening examination/treatment/procedure(s) were conducted as a shared visit with non-physician practitioner(s) and myself.  I personally evaluated the patient during the encounter.  EKG Interpretation   None      This is a 79 year old male who presents with a laceration to the for headache. She sustained a mechanical fall while transferring. He is currently on Plavix. At baseline he has dementia and per EMS report the staff at the facility report that he is at his baseline. Patient is a poor historian. Obvious evidence of head trauma laceration. Bleeding controlled at this time. Head CT negative for acute bleed. Incidentally, patient noted to have bilateral pleural effusions on head CT. Patient is nonambulatory and denies shortness of breath. Given that he is in no respiratory distress and has no complaints otherwise, we'll not further evaluate these findings. Patient will be discharged back to facility.  After history, exam, and medical workup I feel the patient has been appropriately medically screened and is safe for discharge home. Pertinent diagnoses were discussed with the patient. Patient was given return precautions.   Shon Baton, MD 07/08/13 [id number]

## 2014-08-28 ENCOUNTER — Emergency Department (HOSPITAL_COMMUNITY): Payer: Medicare Other

## 2014-08-28 ENCOUNTER — Encounter (HOSPITAL_COMMUNITY): Payer: Self-pay

## 2014-08-28 ENCOUNTER — Emergency Department (HOSPITAL_COMMUNITY)
Admission: EM | Admit: 2014-08-28 | Discharge: 2014-08-28 | Disposition: A | Discharge status: Home / Self Care | Payer: Medicare Other | Attending: Emergency Medicine | Admitting: Emergency Medicine

## 2014-08-28 DIAGNOSIS — K219 Gastro-esophageal reflux disease without esophagitis: Secondary | ICD-10-CM | POA: Diagnosis not present

## 2014-08-28 DIAGNOSIS — S0083XA Contusion of other part of head, initial encounter: Secondary | ICD-10-CM | POA: Diagnosis not present

## 2014-08-28 DIAGNOSIS — I252 Old myocardial infarction: Secondary | ICD-10-CM | POA: Insufficient documentation

## 2014-08-28 DIAGNOSIS — Z7982 Long term (current) use of aspirin: Secondary | ICD-10-CM | POA: Diagnosis not present

## 2014-08-28 DIAGNOSIS — W050XXA Fall from non-moving wheelchair, initial encounter: Secondary | ICD-10-CM | POA: Diagnosis not present

## 2014-08-28 DIAGNOSIS — Y998 Other external cause status: Secondary | ICD-10-CM | POA: Diagnosis not present

## 2014-08-28 DIAGNOSIS — Y9289 Other specified places as the place of occurrence of the external cause: Secondary | ICD-10-CM | POA: Diagnosis not present

## 2014-08-28 DIAGNOSIS — Z8673 Personal history of transient ischemic attack (TIA), and cerebral infarction without residual deficits: Secondary | ICD-10-CM | POA: Insufficient documentation

## 2014-08-28 DIAGNOSIS — Y9389 Activity, other specified: Secondary | ICD-10-CM | POA: Diagnosis not present

## 2014-08-28 DIAGNOSIS — W228XXA Striking against or struck by other objects, initial encounter: Secondary | ICD-10-CM | POA: Insufficient documentation

## 2014-08-28 DIAGNOSIS — Z8639 Personal history of other endocrine, nutritional and metabolic disease: Secondary | ICD-10-CM | POA: Diagnosis not present

## 2014-08-28 DIAGNOSIS — Z79899 Other long term (current) drug therapy: Secondary | ICD-10-CM | POA: Insufficient documentation

## 2014-08-28 DIAGNOSIS — I10 Essential (primary) hypertension: Secondary | ICD-10-CM | POA: Insufficient documentation

## 2014-08-28 DIAGNOSIS — W19XXXA Unspecified fall, initial encounter: Secondary | ICD-10-CM

## 2014-08-28 DIAGNOSIS — Z8659 Personal history of other mental and behavioral disorders: Secondary | ICD-10-CM | POA: Diagnosis not present

## 2014-08-28 DIAGNOSIS — S0990XA Unspecified injury of head, initial encounter: Secondary | ICD-10-CM | POA: Diagnosis present

## 2014-08-28 NOTE — ED Notes (Signed)
Notified [facility] for transportation back home 

## 2014-08-28 NOTE — Discharge Instructions (Signed)
Contusion °A contusion is a deep bruise. Contusions are the result of an injury that caused bleeding under the skin. The contusion may turn blue, purple, or yellow. Minor injuries will give you a painless contusion, but more severe contusions may stay painful and swollen for a few weeks.  °CAUSES  °A contusion is usually caused by a blow, trauma, or direct force to an area of the body. °SYMPTOMS  °· Swelling and redness of the injured area. °· Bruising of the injured area. °· Tenderness and soreness of the injured area. °· Pain. °DIAGNOSIS  °The diagnosis can be made by taking a history and physical exam. An X-ray, CT scan, or MRI may be needed to determine if there were any associated injuries, such as fractures. °TREATMENT  °Specific treatment will depend on what area of the body was injured. In general, the best treatment for a contusion is resting, icing, elevating, and applying cold compresses to the injured area. Over-the-counter medicines may also be recommended for pain control. Ask your caregiver what the best treatment is for your contusion. °HOME CARE INSTRUCTIONS  °· Put ice on the injured area. °¨ Put ice in a plastic bag. °¨ Place a towel between your skin and the bag. °¨ Leave the ice on for 15-20 minutes, 3-4 times a day, or as directed by your health care provider. °· Only take over-the-counter or prescription medicines for pain, discomfort, or fever as directed by your caregiver. Your caregiver may recommend avoiding anti-inflammatory medicines (aspirin, ibuprofen, and naproxen) for 48 hours because these medicines may increase bruising. °· Rest the injured area. °· If possible, elevate the injured area to reduce swelling. °SEEK IMMEDIATE MEDICAL CARE IF:  °· You have increased bruising or swelling. °· You have pain that is getting worse. °· Your swelling or pain is not relieved with medicines. °MAKE SURE YOU:  °· Understand these instructions. °· Will watch your condition. °· Will get help right  away if you are not doing well or get worse. °Document Released: 05/22/2005 Document Revised: 08/17/2013 Document Reviewed: 06/17/2011 °[employer]® Patient Information ©2015 [employer]. This information is not intended to replace advice given to you by your health care provider. Make sure you discuss any questions you have with your health care provider. ° °

## 2014-08-28 NOTE — ED Notes (Addendum)
Pt from [facility].  Pt fell from his wheelchair today striking head on bottom of wheelchair and tile floor.  No LOC.  Pt on Plavix.  Pt fell on Friday as well.  Kerlix to R elbow for skin tear from that fall.  EMS reports that staff at facility state pt always moans and cries.  Pt wants the C-Collar off and is c/o ear pain.  Denies any other pain.  Pt is oriented to name only.  EMS reports this is pt's baseline.

## 2014-08-28 NOTE — ED Notes (Signed)
Patient transported to CT 

## 2014-08-28 NOTE — ED Provider Notes (Signed)
CSN: [id number]     Arrival date & time 08/28/14  1030 History   First MD Initiated Contact with Patient 08/28/14 1049     Chief Complaint  Patient presents with  . Fall     (Consider location/radiation/quality/duration/timing/severity/associated sxs/prior Treatment) HPI Comments: Patient is on Plavix  Patient is a 81 y.o. male presenting with fall. The history is provided by the patient, the nursing home and the EMS personnel. The history is limited by the absence of a caregiver.  Fall This is a new (pt was sitting in his wheelchair and slid out to the floor hitting the left side of his face on the wheelchair) problem. The current episode started less than 1 hour ago. The problem occurs constantly. The problem has not changed since onset.Pertinent negatives include no chest pain, no abdominal pain, no headaches and no shortness of breath. Associated symptoms comments: No neck pain. Nothing aggravates the symptoms. Nothing relieves the symptoms. Treatments tried: Immobilization.    Past Medical History  Diagnosis Date  . Stroke   . Anxiety   . GERD (gastroesophageal reflux disease)   . MI (myocardial infarction)   . Hypertension   . Hyperlipidemia    History reviewed. No pertinent past surgical history. No family history on file. History  Substance Use Topics  . Smoking status: Never Smoker   . Smokeless tobacco: Not on file  . Alcohol Use: No    Review of Systems  Unable to perform ROS Respiratory: Negative for shortness of breath.   Cardiovascular: Negative for chest pain.  Gastrointestinal: Negative for abdominal pain.  Neurological: Negative for headaches.      Allergies  Review of patient's allergies indicates no known allergies.  Home Medications   Prior to Admission medications   Medication Sig Start Date End Date Taking? Authorizing Provider  acetaminophen (TYLENOL) 500 MG tablet Take 500 mg by mouth every 4 (four) hours as needed for mild pain or fever.  Pain    Yes Historical Provider, MD  alum & mag hydroxide-simeth (MAALOX/MYLANTA) 200-200-20 MG/5ML suspension Take 30 mLs by mouth 4 (four) times daily as needed for indigestion or heartburn.   Yes Historical Provider, MD  aspirin 81 MG chewable tablet Chew 81 mg by mouth daily.   Yes Historical Provider, MD  carvedilol (COREG) 3.125 MG tablet Take 3.125 mg by mouth 2 (two) times daily with a meal.     Yes Historical Provider, MD  clopidogrel (PLAVIX) 75 MG tablet Take 75 mg by mouth daily.     Yes Historical Provider, MD  guaiFENesin (ROBITUSSIN) 100 MG/5ML liquid Take 200 mg by mouth every 6 (six) hours as needed for cough.   Yes Historical Provider, MD  loperamide (IMODIUM) 2 MG capsule Take 2 mg by mouth 4 (four) times daily as needed for diarrhea or loose stools.   Yes Historical Provider, MD  Magnesium Hydroxide (MILK OF MAGNESIA PO) Take 30 mLs by mouth at bedtime as needed. For constipation   Yes Historical Provider, MD  nitroGLYCERIN (NITROSTAT) 0.4 MG SL tablet Place 0.4 mg under the tongue every 5 (five) minutes as needed for chest pain.    Yes Historical Provider, MD  pantoprazole (PROTONIX) 40 MG tablet Take 40 mg by mouth daily.     Yes Historical Provider, MD  Tamsulosin HCl (FLOMAX) 0.4 MG CAPS Take 0.4 mg by mouth daily.    Yes Historical Provider, MD   BP 132/55 mmHg  Pulse 71  Temp(Src) 97.6 F (36.4 C) (Oral)  Resp  18  SpO2 96% Physical Exam  Constitutional: He appears well-developed and well-nourished.  Patient is very distressed while the c-collar was on however when palpated he had no neck pain and when c-collar was removed he was comfortable, speaking and expressing no area of pain  HENT:  Head: Normocephalic. Head is with contusion.    Eyes: EOM are normal. Pupils are equal, round, and reactive to light.  Neck: Normal range of motion. Neck supple. No spinous process tenderness and no muscular tenderness present.  Cardiovascular: Normal rate, normal heart sounds  and intact distal pulses.   Pulmonary/Chest: Effort normal. No respiratory distress.  Abdominal: Soft. He exhibits no distension. There is no tenderness.  Musculoskeletal:       Right hip: Normal.       Left hip: Normal.  Neurological: He is alert.  Skin: Skin is warm and dry.  Psychiatric: He has a normal mood and affect. His behavior is normal.  Nursing note and vitals reviewed.   ED Course  Procedures (including critical care time) Labs Review Labs Reviewed - No data to display  Imaging Review Ct Head Wo Contrast  08/28/2014   CLINICAL DATA:  Initial encounter for patient fall. Chronic anticoagulation.  EXAM: CT HEAD WITHOUT CONTRAST  CT CERVICAL SPINE WITHOUT CONTRAST  TECHNIQUE: Multidetector CT imaging of the head and cervical spine was performed following the standard protocol without intravenous contrast. Multiplanar CT image reconstructions of the cervical spine were also generated.  COMPARISON:  07/07/2013  FINDINGS: CT HEAD FINDINGS  There is no evidence for acute hemorrhage, hydrocephalus, mass lesion, or abnormal extra-axial fluid collection. No definite CT evidence for acute infarction. Diffuse loss of parenchymal volume is consistent with atrophy. Patchy low attenuation in the deep hemispheric and periventricular white matter is nonspecific, but likely reflects chronic microvascular ischemic demyelination. Stable appearance of old high left parietal infarct with underlying ex vacuo dilatation of the left lateral ventricle.  The visualized paranasal sinuses and mastoid air cells are clear. Stable opacification of a few scattered mastoid air cells. No evidence for skull fracture  CT CERVICAL SPINE FINDINGS  Imaging was obtained from the skullbase through the T2 vertebral body. No evidence for fracture. Trace anterolisthesis of C4 on 5 and C5 on 6 is stable and compatible with the degree of facet disease at these levels. There is diffuse facet osteoarthritis bilaterally. Loss of disc  height at C4-5 and C6-7 is stable. Normal cervical lordosis is preserved. No evidence for prevertebral soft tissue edema.  IMPRESSION: 1. Stable CT evaluation of the brain. 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 No new or progressive findings. 2. Diffuse degenerative changes in the cervical spine without fracture.   Electronically Signed   By: Kennith Center M.D.   On: 08/28/2014 11:57   Ct Cervical Spine Wo Contrast  08/28/2014   CLINICAL DATA:  Initial encounter for patient fall. Chronic anticoagulation.  EXAM: CT HEAD WITHOUT CONTRAST  CT CERVICAL SPINE WITHOUT CONTRAST  TECHNIQUE: Multidetector CT imaging of the head and cervical spine was performed following the standard protocol without intravenous contrast. Multiplanar CT image reconstructions of the cervical spine were also generated.  COMPARISON:  07/07/2013  FINDINGS: CT HEAD FINDINGS  There is no evidence for acute hemorrhage, hydrocephalus, mass lesion, or abnormal extra-axial fluid collection. No definite CT evidence for acute infarction. Diffuse loss of parenchymal volume is consistent with atrophy. Patchy low attenuation in the deep hemispheric and periventricular  white matter is nonspecific, but likely reflects chronic microvascular ischemic demyelination. Stable appearance of old high left parietal infarct with underlying ex vacuo dilatation of the left lateral ventricle.  The visualized paranasal sinuses and mastoid air cells are clear. Stable opacification of a few scattered mastoid air cells. No evidence for skull fracture  CT CERVICAL SPINE FINDINGS  Imaging was obtained from the skullbase through the T2 vertebral body. No evidence for fracture. Trace anterolisthesis of C4 on 5 and C5 on 6 is stable and compatible with the degree of facet disease at these levels. There is diffuse facet osteoarthritis bilaterally. Loss of disc height at C4-5 and C6-7 is stable. Normal  cervical lordosis is preserved. No evidence for prevertebral soft tissue edema.  IMPRESSION: 1. Stable CT evaluation of the brain. 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 No new or progressive findings. 2. Diffuse degenerative changes in the cervical spine without fracture.   Electronically Signed   By: Kennith Center M.D.   On: 08/28/2014 11:57     EKG Interpretation None      MDM   Final diagnoses:  Fall    Patient with a mechanical fall out of the wheelchair today where the left side of his for head hit the wheelchair bottom. No LOC and patient is at his baseline per facility. Patient does take Plavix. He has no C-spine tenderness is able to range his neck. It was arranged bilateral hips without pain and has no trauma to the trunk.  CT of the head and C-spine pending.  12:02 PM CT's neg and pt d/ced home.    Gwyneth Sprout, MD 08/28/14 1202

## 2014-09-19 ENCOUNTER — Emergency Department (HOSPITAL_COMMUNITY)
Admission: EM | Admit: 2014-09-19 | Discharge: 2014-09-19 | Disposition: A | Discharge status: Home / Self Care | Payer: Medicare Other | Attending: Emergency Medicine | Admitting: Emergency Medicine

## 2014-09-19 ENCOUNTER — Emergency Department (HOSPITAL_COMMUNITY): Payer: Medicare Other

## 2014-09-19 ENCOUNTER — Encounter (HOSPITAL_COMMUNITY): Payer: Self-pay | Admitting: *Deleted

## 2014-09-19 DIAGNOSIS — Y998 Other external cause status: Secondary | ICD-10-CM | POA: Diagnosis not present

## 2014-09-19 DIAGNOSIS — I252 Old myocardial infarction: Secondary | ICD-10-CM | POA: Insufficient documentation

## 2014-09-19 DIAGNOSIS — Y9289 Other specified places as the place of occurrence of the external cause: Secondary | ICD-10-CM | POA: Diagnosis not present

## 2014-09-19 DIAGNOSIS — Z7902 Long term (current) use of antithrombotics/antiplatelets: Secondary | ICD-10-CM | POA: Diagnosis not present

## 2014-09-19 DIAGNOSIS — Y9389 Activity, other specified: Secondary | ICD-10-CM | POA: Insufficient documentation

## 2014-09-19 DIAGNOSIS — K219 Gastro-esophageal reflux disease without esophagitis: Secondary | ICD-10-CM | POA: Diagnosis not present

## 2014-09-19 DIAGNOSIS — F419 Anxiety disorder, unspecified: Secondary | ICD-10-CM | POA: Diagnosis not present

## 2014-09-19 DIAGNOSIS — E782 Mixed hyperlipidemia: Secondary | ICD-10-CM | POA: Diagnosis not present

## 2014-09-19 DIAGNOSIS — S51011A Laceration without foreign body of right elbow, initial encounter: Secondary | ICD-10-CM

## 2014-09-19 DIAGNOSIS — Z79899 Other long term (current) drug therapy: Secondary | ICD-10-CM | POA: Diagnosis not present

## 2014-09-19 DIAGNOSIS — S59901A Unspecified injury of right elbow, initial encounter: Secondary | ICD-10-CM | POA: Diagnosis present

## 2014-09-19 DIAGNOSIS — S5001XA Contusion of right elbow, initial encounter: Secondary | ICD-10-CM | POA: Diagnosis not present

## 2014-09-19 DIAGNOSIS — Z8673 Personal history of transient ischemic attack (TIA), and cerebral infarction without residual deficits: Secondary | ICD-10-CM | POA: Insufficient documentation

## 2014-09-19 DIAGNOSIS — W19XXXA Unspecified fall, initial encounter: Secondary | ICD-10-CM

## 2014-09-19 MED ORDER — TETANUS-DIPHTH-ACELL PERTUSSIS 5-2.5-18.5 LF-MCG/0.5 IM SUSP
0.5000 mL | Freq: Once | INTRAMUSCULAR | Status: AC
  Administered 2014-09-19: 0.5 mL via INTRAMUSCULAR
  Filled 2014-09-19: qty 0.5

## 2014-09-19 NOTE — ED Notes (Signed)
Pt back from x-ray.

## 2014-09-19 NOTE — ED Notes (Signed)
[facility] here to transport pt back to [facility]. 

## 2014-09-19 NOTE — ED Notes (Signed)
[facility] called for transport.  

## 2014-09-19 NOTE — Discharge Instructions (Signed)
Return to the ED with any concerns including increased pain or swelling of elbow, redness around wound, pus draining, vomiting, seizure activity, decreased level of alertness/lethargy, or any other alarming symptoms

## 2014-09-19 NOTE — ED Notes (Signed)
[facility] Expected date:  Expected time:  Means of arrival:  Comments: EMS- shoulder pain, previously seen 3xs for same

## 2014-09-19 NOTE — ED Notes (Signed)
rn called and gave report to facility,went over discharge paperwork with rn at facility. waiting for [facility] to transport pt back.

## 2014-09-19 NOTE — ED Notes (Signed)
[facility] was called to follow-up on pt's transportation---- staff stated pt is still in queue.

## 2014-09-19 NOTE — ED Provider Notes (Signed)
CSN: [id number]     Arrival date & time 09/19/14  1415 History   First MD Initiated Contact with Patient 09/19/14 1511     Chief Complaint  Patient presents with  . Fall     (Consider location/radiation/quality/duration/timing/severity/associated sxs/prior Treatment) HPI  A LEVEL 5 CAVEAT PERTAINS DUE TO APHASIA Pt presenting from [facility] after fall from his wheelchair.  Per report, pt slid out of his wheelchair.  Fall was not witnessed by staff.  Unsure whether he hit his head.  Pt denies pain.  He has skin tear of right elbow.  He takes plavix.  He has hx of stroke, right sided deficits and aphasia per report.  Pt grimaces when moving right arm- he appears weak in this arm- may be due to prior stroke, but he does seem to have pain with palpation and movement of right upper extremity.   Past Medical History  Diagnosis Date  . Stroke   . Anxiety   . GERD (gastroesophageal reflux disease)   . MI (myocardial infarction)   . Hypertension   . Hyperlipidemia    History reviewed. No pertinent past surgical history. History reviewed. No pertinent family history. History  Substance Use Topics  . Smoking status: Never Smoker   . Smokeless tobacco: Not on file  . Alcohol Use: No    Review of Systems  UNABLE TO OBTAIN ROS DUE TO LEVEL 5 CAVEAT    Allergies  Review of patient's allergies indicates no known allergies.  Home Medications   Prior to Admission medications   Medication Sig Start Date End Date Taking? Authorizing Provider  acetaminophen (TYLENOL) 500 MG tablet Take 500 mg by mouth every 4 (four) hours as needed for mild pain, fever or headache.    Yes Historical Provider, MD  alum & mag hydroxide-simeth (MAALOX/MYLANTA) 200-200-20 MG/5ML suspension Take 30 mLs by mouth 4 (four) times daily as needed for indigestion or heartburn.   Yes Historical Provider, MD  aspirin 81 MG chewable tablet Chew 81 mg by mouth daily.   Yes Historical Provider, MD  carvedilol (COREG)  3.125 MG tablet Take 3.125 mg by mouth 2 (two) times daily with a meal.     Yes Historical Provider, MD  clopidogrel (PLAVIX) 75 MG tablet Take 75 mg by mouth daily.     Yes Historical Provider, MD  docusate sodium (COLACE) 100 MG capsule Take 100 mg by mouth 2 (two) times daily.   Yes Historical Provider, MD  guaiFENesin (ROBITUSSIN) 100 MG/5ML liquid Take 200 mg by mouth every 6 (six) hours as needed for cough.   Yes Historical Provider, MD  loperamide (IMODIUM) 2 MG capsule Take 2 mg by mouth 4 (four) times daily as needed for diarrhea or loose stools.   Yes Historical Provider, MD  Magnesium Hydroxide (MILK OF MAGNESIA PO) Take 30 mLs by mouth at bedtime as needed. For constipation   Yes Historical Provider, MD  neomycin-bacitracin-polymyxin (NEOSPORIN) ointment Apply 1 application topically daily as needed for wound care. Apply to right and left elbow wounds and cover with dressing   Yes Historical Provider, MD  nitroGLYCERIN (NITROSTAT) 0.4 MG SL tablet Place 0.4 mg under the tongue every 5 (five) minutes as needed for chest pain.    Yes Historical Provider, MD  pantoprazole (PROTONIX) 40 MG tablet Take 40 mg by mouth daily.     Yes Historical Provider, MD  polyethylene glycol (MIRALAX / GLYCOLAX) packet Take 17 g by mouth daily with breakfast.   Yes Historical Provider,  MD  Tamsulosin HCl (FLOMAX) 0.4 MG CAPS Take 0.4 mg by mouth daily.    Yes Historical Provider, MD  white petrolatum (VASELINE) GEL Apply 1 application topically as needed (to red areas on buttocks with each incontinent change).   Yes Historical Provider, MD   BP 146/62 mmHg  Pulse 87  Temp(Src) 98.2 F (36.8 C) (Oral)  Resp 18  SpO2 95%  Vitals reviewed Physical Exam  Physical Examination: General appearance - alert, well appearing, and in no distress Mental status - alert, oriented to person, place, and time, but with some aphasia Head- NCAT Eyes - pupils equal and reactive, extraocular eye movements intact Neck - no  midline tenderness Chest - clear to auscultation, no wheezes, rales or rhonchi, symmetric air entry Heart - normal rate, regular rhythm, normal S1, S2, no murmurs, rubs, clicks or gallops Abdomen - soft, nontender, nondistended, no masses or organomegaly Back exam - full range of motion, no tenderness, palpable spasm or pain on motion Neurological - alert, some aphasia, moving all extremities, right upper extremity with some flaccid paralysis Musculoskeletal -ttp over right shoulder, right elbow, right wrist, otherwise  no joint tenderness, deformity or swelling Extremities - peripheral pulses normal, no pedal edema, no clubbing or cyanosis Skin - normal coloration and turgor, no rashes, small dime sized skin tear over right elbow  ED Course  Procedures (including critical care time) Labs Review Labs Reviewed - No data to display  Imaging Review Dg Shoulder Right  09/19/2014   CLINICAL DATA:  Fall from wheelchair today. Right shoulder pain. Initial encounter.  EXAM: RIGHT SHOULDER - 2+ VIEW  COMPARISON:  Right shoulder radiographs 04/05/2011  FINDINGS: Advanced degenerative changes are again noted at the glenohumeral joint. The shoulder is located. No acute abnormality is present. The right hemithorax is clear. The clavicle is intact.  IMPRESSION: 1. Advanced degenerative changes of the right shoulder. 2. No acute abnormality.   Electronically Signed   By: Gennette Pac M.D.   On: 09/19/2014 16:20   Dg Elbow Complete Right  09/19/2014   CLINICAL DATA:  RIGHT elbow pain.  Fall from wheelchair.  EXAM: RIGHT ELBOW - COMPLETE 3+ VIEW  COMPARISON:  04/05/2011.  FINDINGS: There is no evidence of fracture, dislocation, or joint effusion. There is no evidence of arthropathy or other focal bone abnormality. Soft tissues are unremarkable.  IMPRESSION: Negative.   Electronically Signed   By: Andreas Newport M.D.   On: 09/19/2014 16:22   Dg Wrist Complete Right  09/19/2014   CLINICAL DATA:  Fall from  wheelchair today. Right wrist pain. Initial encounter.  EXAM: RIGHT WRIST - COMPLETE 3+ VIEW  COMPARISON:  None.  FINDINGS: Moderate osteopenia is present. The wrist is located. No acute fracture is present. There is some soft tissue swelling about the wrist. Vascular calcifications are noted as well.  IMPRESSION: 1. No acute fracture or dislocation. 2. Soft tissue swelling about the wrist, particularly along the volar aspect. 3. Moderate osteopenia. 4. Small vessel vascular calcifications, commonly seen in the setting of diabetes.   Electronically Signed   By: Gennette Pac M.D.   On: 09/19/2014 16:22   Ct Head Wo Contrast  09/19/2014   CLINICAL DATA:  Status post fall out of a wheelchair today. Initial encounter.  EXAM: CT HEAD WITHOUT CONTRAST  TECHNIQUE: Contiguous axial images were obtained from the base of the skull through the vertex without intravenous contrast.  COMPARISON:  Head CT scan 08/28/2014 and 07/07/2013.  FINDINGS: Atrophy, chronic microvascular  ischemic change and a remote left parietal lobe infarct are again seen. There is no evidence of acute intracranial abnormality including hemorrhage, infarct, mass lesion, and mass effect, midline shift or abnormal extra-axial fluid collection. No hydrocephalus or pneumocephalus. The calvarium is intact.  IMPRESSION: No acute finding.  Stable compared to prior exams.   Electronically Signed   By: Drusilla Kannerhomas  Dalessio M.D.   On: 09/19/2014 16:00     EKG Interpretation None      MDM   Final diagnoses:  Fall  Skin tear of right elbow without complication, initial encounter    Pt presenting with c/o fall/slipping out of wheelchair.  Pt with small skin tear over right elbow.  He has some right sided weakness.  No significant evidence of trauma- xrays are reassuring.  CT head obtained due to unwitnessed fall and patient on plavix, but this was reassuring as well.  Discharged with strict return precautions.  Pt agreeable with plan.   Ethelda ChickMartha K  Linker, MD 09/19/14 [phone]

## 2014-09-19 NOTE — Progress Notes (Signed)
CSW met with patient at bedside. There was no family present. Patient confirms he is from [facility]. Per chart, patient presents to the ED due to slipping out of his wheelchair. However when asked, patient does not remember what caused fall. Patient does state that his roommate witnessed fall.   Willette Brace [phone] ED CSW 09/19/2014 5:24 PM

## 2014-09-19 NOTE — ED Notes (Addendum)
Per ems pt is from [facility], called out for fall, pt was in wheelchair, pt slipped out of wheelchair. Staff did not witness, but roommate did witness fall. Roommate is alert and oriented x4, did not see pt hit his head. Mark above left eye is chronic. Pt denies pain.  Hx of stroke, hx right sided deficits, and aphasia. Pt is fidgety. Skin tear size of a dime on right elbow, pt on blood thinners. Pt will not right arm, unsure if pt has acute injury to right shoulder.

## 2014-09-19 NOTE — ED Notes (Signed)
Patient dressed and ready for transport. Patient has on dry brief.

## 2014-09-19 NOTE — ED Notes (Signed)
Pt still waiting for ptar to arrive to transport back to facility.

## 2014-09-19 NOTE — ED Notes (Signed)
Pt to xray

## 2014-09-19 NOTE — ED Notes (Signed)
Bed: [id number] Expected date:  Expected time:  Means of arrival:  Comments: EMS- elderly, fall, elbow injury

## 2014-10-31 ENCOUNTER — Encounter (HOSPITAL_COMMUNITY): Payer: Self-pay

## 2014-10-31 ENCOUNTER — Emergency Department (HOSPITAL_COMMUNITY)

## 2014-10-31 ENCOUNTER — Emergency Department (HOSPITAL_COMMUNITY)
Admission: EM | Admit: 2014-10-31 | Discharge: 2014-10-31 | Disposition: A | Discharge status: Home / Self Care | Attending: Emergency Medicine | Admitting: Emergency Medicine

## 2014-10-31 ENCOUNTER — Other Ambulatory Visit (HOSPITAL_COMMUNITY): Payer: Self-pay

## 2014-10-31 DIAGNOSIS — Z8659 Personal history of other mental and behavioral disorders: Secondary | ICD-10-CM | POA: Insufficient documentation

## 2014-10-31 DIAGNOSIS — R531 Weakness: Secondary | ICD-10-CM | POA: Insufficient documentation

## 2014-10-31 DIAGNOSIS — I10 Essential (primary) hypertension: Secondary | ICD-10-CM | POA: Insufficient documentation

## 2014-10-31 DIAGNOSIS — Z7902 Long term (current) use of antithrombotics/antiplatelets: Secondary | ICD-10-CM | POA: Diagnosis not present

## 2014-10-31 DIAGNOSIS — Z7982 Long term (current) use of aspirin: Secondary | ICD-10-CM | POA: Insufficient documentation

## 2014-10-31 DIAGNOSIS — Z8639 Personal history of other endocrine, nutritional and metabolic disease: Secondary | ICD-10-CM | POA: Diagnosis not present

## 2014-10-31 DIAGNOSIS — R4182 Altered mental status, unspecified: Secondary | ICD-10-CM | POA: Diagnosis present

## 2014-10-31 DIAGNOSIS — I639 Cerebral infarction, unspecified: Secondary | ICD-10-CM | POA: Diagnosis not present

## 2014-10-31 DIAGNOSIS — K219 Gastro-esophageal reflux disease without esophagitis: Secondary | ICD-10-CM | POA: Diagnosis not present

## 2014-10-31 DIAGNOSIS — Z79899 Other long term (current) drug therapy: Secondary | ICD-10-CM | POA: Insufficient documentation

## 2014-10-31 DIAGNOSIS — R410 Disorientation, unspecified: Secondary | ICD-10-CM | POA: Diagnosis not present

## 2014-10-31 DIAGNOSIS — I252 Old myocardial infarction: Secondary | ICD-10-CM | POA: Insufficient documentation

## 2014-10-31 DIAGNOSIS — Z8673 Personal history of transient ischemic attack (TIA), and cerebral infarction without residual deficits: Secondary | ICD-10-CM | POA: Insufficient documentation

## 2014-10-31 LAB — COMPREHENSIVE METABOLIC PANEL
ALT: 10 U/L (ref 0–53)
AST: 26 U/L (ref 0–37)
Albumin: 3.2 g/dL — ABNORMAL LOW (ref 3.5–5.2)
Alkaline Phosphatase: 60 U/L (ref 39–117)
Anion gap: 6 (ref 5–15)
BILIRUBIN TOTAL: 0.7 mg/dL (ref 0.3–1.2)
BUN: 31 mg/dL — ABNORMAL HIGH (ref 6–23)
CHLORIDE: 107 mmol/L (ref 96–112)
CO2: 25 mmol/L (ref 19–32)
Calcium: 8.4 mg/dL (ref 8.4–10.5)
Creatinine, Ser: 1.54 mg/dL — ABNORMAL HIGH (ref 0.50–1.35)
GFR calc Af Amer: 43 mL/min — ABNORMAL LOW (ref >90)
GFR, EST NON AFRICAN AMERICAN: 37 mL/min — AB (ref >90)
Glucose, Bld: 113 mg/dL — ABNORMAL HIGH (ref 70–99)
Potassium: 4.6 mmol/L (ref 3.5–5.1)
SODIUM: 138 mmol/L (ref 135–145)
Total Protein: 6.6 g/dL (ref 6.0–8.3)

## 2014-10-31 LAB — CBC WITH DIFFERENTIAL/PLATELET
BASOS PCT: 0 % (ref 0–1)
Basophils Absolute: 0 10*3/uL (ref 0.0–0.1)
EOS ABS: 0 10*3/uL (ref 0.0–0.7)
EOS PCT: 1 % (ref 0–5)
HCT: 39 % (ref 39.0–52.0)
Hemoglobin: 12.8 g/dL — ABNORMAL LOW (ref 13.0–17.0)
Lymphocytes Relative: 10 % — ABNORMAL LOW (ref 12–46)
Lymphs Abs: 0.7 10*3/uL (ref 0.7–4.0)
MCH: 31.1 pg (ref 26.0–34.0)
MCHC: 32.8 g/dL (ref 30.0–36.0)
MCV: 94.7 fL (ref 78.0–100.0)
MONOS PCT: 10 % (ref 3–12)
Monocytes Absolute: 0.7 10*3/uL (ref 0.1–1.0)
NEUTROS PCT: 79 % — AB (ref 43–77)
Neutro Abs: 5.6 10*3/uL (ref 1.7–7.7)
PLATELETS: 159 10*3/uL (ref 150–400)
RBC: 4.12 MIL/uL — AB (ref 4.22–5.81)
RDW: 14 % (ref 11.5–15.5)
WBC: 7.1 10*3/uL (ref 4.0–10.5)

## 2014-10-31 LAB — URINE MICROSCOPIC-ADD ON

## 2014-10-31 LAB — URINALYSIS, ROUTINE W REFLEX MICROSCOPIC
BILIRUBIN URINE: NEGATIVE
Glucose, UA: NEGATIVE mg/dL
Ketones, ur: NEGATIVE mg/dL
Leukocytes, UA: NEGATIVE
NITRITE: NEGATIVE
PH: 6 (ref 5.0–8.0)
Protein, ur: NEGATIVE mg/dL
SPECIFIC GRAVITY, URINE: 1.019 (ref 1.005–1.030)
Urobilinogen, UA: 1 mg/dL (ref 0.0–1.0)

## 2014-10-31 LAB — I-STAT TROPONIN, ED: Troponin i, poc: 0.03 ng/mL (ref 0.00–0.08)

## 2014-10-31 NOTE — ED Notes (Signed)
Pt family updated on plan of care, son refusing SLP evaluation, states pt is at his baseline and was able to eat and drink without any issues for him.  MD updated and aware.

## 2014-10-31 NOTE — Consult Note (Signed)
Referring Physician: Silverio Lay    Chief Complaint: Abnormal speech and difficulty swallowing  HPI:                                                                                                                                         Bobby Boyd is an 81 y.o. male who lives at [facility]. Pt is usually able to talk and answer questions. Speech is usually not 100 % clear but is clear enough you can understand the words. Staff called for a sick call. Pt was sitting in the wheelchair in the common room and leaning to the left side. Staff noticed he was leaning to the left during second shift yesterday and speech wasn't normal. CT head obtained in ED shows old left parietal lobe infarct but no acute infarct.  Neurology was consulted for possible Stroke.   Date last known well: Unable to determine Time last known well: Unable to determine tPA Given: No: out of window Modified Rankin: Rankin Score=3    Past Medical History  Diagnosis Date  . Stroke   . Anxiety   . GERD (gastroesophageal reflux disease)   . MI (myocardial infarction)   . Hypertension   . Hyperlipidemia     History reviewed. No pertinent past surgical history.  No family history on file. Social History:  reports that he has never smoked. He does not have any smokeless tobacco history on file. He reports that he does not drink alcohol or use illicit drugs. Family history: unable to determine due to mental status and family unavailable.  Allergies: No Known Allergies  Medications:                                                                                                                           No current facility-administered medications for this encounter.   Current Outpatient Prescriptions  Medication Sig Dispense Refill  . acetaminophen (TYLENOL) 500 MG tablet Take 500 mg by mouth every 4 (four) hours as needed for mild pain, fever or headache.     Marland Kitchen alum & mag hydroxide-simeth  (MAALOX/MYLANTA) 200-200-20 MG/5ML suspension Take 30 mLs by mouth 4 (four) times daily as needed for indigestion or heartburn.    Marland Kitchen aspirin 81 MG chewable tablet Chew 81 mg by mouth at bedtime.     . carvedilol (COREG) 3.125 MG tablet Take 3.125  mg by mouth 2 (two) times daily with a meal.      . clopidogrel (PLAVIX) 75 MG tablet Take 75 mg by mouth daily.      . Cranberry 200 MG CAPS Take 200 mg by mouth 2 (two) times daily.    Marland Kitchen docusate sodium (COLACE) 100 MG capsule Take 100 mg by mouth 2 (two) times daily.    Marland Kitchen guaiFENesin (ROBITUSSIN) 100 MG/5ML liquid Take 200 mg by mouth every 6 (six) hours as needed for cough.    . loperamide (IMODIUM) 2 MG capsule Take 2 mg by mouth 4 (four) times daily as needed for diarrhea or loose stools.    . Magnesium Hydroxide (MILK OF MAGNESIA PO) Take 30 mLs by mouth at bedtime as needed. For constipation    . neomycin-bacitracin-polymyxin (NEOSPORIN) ointment Apply 1 application topically daily as needed for wound care. Apply to right and left elbow wounds and cover with dressing    . nitroGLYCERIN (NITROSTAT) 0.4 MG SL tablet Place 0.4 mg under the tongue every 5 (five) minutes as needed for chest pain.     . pantoprazole (PROTONIX) 40 MG tablet Take 40 mg by mouth daily.      . polyethylene glycol (MIRALAX / GLYCOLAX) packet Take 17 g by mouth daily with breakfast.    . Tamsulosin HCl (FLOMAX) 0.4 MG CAPS Take 0.4 mg by mouth daily.     . white petrolatum (VASELINE) GEL Apply 1 application topically as needed (to red areas on buttocks with each incontinent change).      ROS:                                                                                                                                       History obtained from the patient  General ROS: negative for - chills, fatigue, fever, night sweats, weight gain or weight loss Psychological ROS: negative for - behavioral disorder, hallucinations, memory difficulties, mood swings or suicidal  ideation Ophthalmic ROS: negative for - blurry vision, double vision, eye pain or loss of vision ENT ROS: negative for - epistaxis, nasal discharge, oral lesions, sore throat, tinnitus or vertigo Allergy and Immunology ROS: negative for - hives or itchy/watery eyes Hematological and Lymphatic ROS: negative for - bleeding problems, bruising or swollen lymph nodes Endocrine ROS: negative for - galactorrhea, hair pattern changes, polydipsia/polyuria or temperature intolerance Respiratory ROS: negative for - cough, hemoptysis, shortness of breath or wheezing Cardiovascular ROS: negative for - chest pain, dyspnea on exertion, edema or irregular heartbeat Gastrointestinal ROS: negative for - abdominal pain, diarrhea, hematemesis, nausea/vomiting or stool incontinence Genito-Urinary ROS: negative for - dysuria, hematuria, incontinence or urinary frequency/urgency Musculoskeletal ROS: negative for - joint swelling or muscular weakness Neurological ROS: as noted in HPI Dermatological ROS: negative for rash and skin lesion changes  Physical Examination:  Blood pressure 128/66, pulse 69, temperature 98.1 F (36.7 C), temperature source Oral, resp. rate 16, SpO2 100 %.  HEENT-  Normocephalic, no lesions, without obvious abnormality.  Normal external eye and conjunctiva.  Normal TM's bilaterally.  Normal auditory canals and external ears. Normal external nose, mucus membranes and septum.  Normal pharynx. Cardiovascular- regular rate and rhythm, pulses palpable throughout   Lungs- chest clear, no wheezing, rales, normal symmetric air entry, Abdomen- normal findings: bowel sounds normal Extremities- no edema Lymph-no adenopathy palpable Musculoskeletal-no joint tenderness, deformity or swelling Skin-warm and dry, no hyperpigmentation, vitiligo, or suspicious lesions  Neurological Examination Mental  Status: Alert, oriented.  Speech slightly slurred but this is stated to be baseline. No evidence of aphasia.  Able to follow simple step commands without difficulty. Cranial Nerves: II: Discs flat bilaterally; right hemianopsia (old), pupils equal, round, reactive to light and accommodation III,IV, VI: ptosis not present, extra-ocular motions intact bilaterally V,VII: smile asymmetric on the right (old), facial light touch sensation decreased on the right (old) VIII: hearing decreased  bilaterally IX,X: uvula rises symmetrically XI: bilateral shoulder shrug XII: midline tongue extension Motor: Right upper extremity is hemiplegic held in flexion contracture, right LE hemiplegic, left UE and LE 4-/5 strength Sensory: Pinprick and light touch intact throughout, bilaterally Deep Tendon Reflexes: depressed throughout Plantars: Right: mute   Left: downgoing Cerebellar: normal finger-to-nos on the left, Gait: not tested due to safety.    Lab Results: Basic Metabolic Panel: No results for input(s): NA, K, CL, CO2, GLUCOSE, BUN, CREATININE, CALCIUM, MG, PHOS in the last 168 hours.  Liver Function Tests: No results for input(s): AST, ALT, ALKPHOS, BILITOT, PROT, ALBUMIN in the last 168 hours. No results for input(s): LIPASE, AMYLASE in the last 168 hours. No results for input(s): AMMONIA in the last 168 hours.  CBC: No results for input(s): WBC, NEUTROABS, HGB, HCT, MCV, PLT in the last 168 hours.  Cardiac Enzymes: No results for input(s): CKTOTAL, CKMB, CKMBINDEX, TROPONINI in the last 168 hours.  Lipid Panel: No results for input(s): CHOL, TRIG, HDL, CHOLHDL, VLDL, LDLCALC in the last 168 hours.  CBG: No results for input(s): GLUCAP in the last 168 hours.  Microbiology: No results found for this or any previous visit.  Coagulation Studies: No results for input(s): LABPROT, INR in the last 72 hours.  Imaging: Ct Head Wo Contrast  10/31/2014   CLINICAL DATA:  Altered mental  status  EXAM: CT HEAD WITHOUT CONTRAST  CT CERVICAL SPINE WITHOUT CONTRAST  TECHNIQUE: Multidetector CT imaging of the head and cervical spine was performed following the standard protocol without intravenous contrast. Multiplanar CT image reconstructions of the cervical spine were also generated.  COMPARISON:  09/19/2014  FINDINGS: CT HEAD FINDINGS  No skull fracture is noted. Atherosclerotic calcifications of carotid siphon again noted. Paranasal sinuses and mastoid air cells are unremarkable. Stable cerebral atrophy. Again noted periventricular and subcortical chronic white matter disease. Old infarct in left parietal lobe is stable. No definite acute cortical infarction. No mass lesion is noted on this unenhanced scan.  CT CERVICAL SPINE FINDINGS  Axial images of the cervical spine shows no acute fracture or subluxation. Computer processed images shows degenerative changes C1-C2 articulation. Again noted about 2.4 mm anterolisthesis C4 on C5. Stable about 1.6 mm anterolisthesis C5 on C6 vertebral body. There is disc space flattening with vacuum disc phenomenon at C4-C5-C5-C6 and C6-C7 level. Mild disc space flattening at C7-T1. Mild anterior and mild posterior spurring at C6-C7 level. No prevertebral soft  tissue swelling. Cervical airway is patent.  IMPRESSION: 1. No acute intracranial abnormality. Stable atrophy and chronic white matter disease. 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 2. No cervical spine acute fracture or subluxation. Stable mild anterolisthesis C4 on C5 vertebral body and C5 on C6 vertebral body. Stable degenerative changes as described above.   Electronically Signed   By: Natasha Mead M.D.   On: 10/31/2014 09:05   Ct Cervical Spine Wo Contrast  10/31/2014   CLINICAL DATA:  Altered mental status  EXAM: CT HEAD WITHOUT CONTRAST  CT CERVICAL SPINE WITHOUT CONTRAST  TECHNIQUE: Multidetector CT imaging of the head and cervical spine was performed following the standard protocol without  intravenous contrast. Multiplanar CT image reconstructions of the cervical spine were also generated.  COMPARISON:  09/19/2014  FINDINGS: CT HEAD FINDINGS  No skull fracture is noted. Atherosclerotic calcifications of carotid siphon again noted. Paranasal sinuses and mastoid air cells are unremarkable. Stable cerebral atrophy. Again noted periventricular and subcortical chronic white matter disease. Old infarct in left parietal lobe is stable. No definite acute cortical infarction. No mass lesion is noted on this unenhanced scan.  CT CERVICAL SPINE FINDINGS  Axial images of the cervical spine shows no acute fracture or subluxation. Computer processed images shows degenerative changes C1-C2 articulation. Again noted about 2.4 mm anterolisthesis C4 on C5. Stable about 1.6 mm anterolisthesis C5 on C6 vertebral body. There is disc space flattening with vacuum disc phenomenon at C4-C5-C5-C6 and C6-C7 level. Mild disc space flattening at C7-T1. Mild anterior and mild posterior spurring at C6-C7 level. No prevertebral soft tissue swelling. Cervical airway is patent.  IMPRESSION: 1. No acute intracranial abnormality. Stable atrophy and chronic white matter disease. 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 2. No cervical spine acute fracture or subluxation. Stable mild anterolisthesis C4 on C5 vertebral body and C5 on C6 vertebral body. Stable degenerative changes as described above.   Electronically Signed   By: Natasha Mead M.D.   On: 10/31/2014 09:05   Dg Chest Port 1 View  10/31/2014   CLINICAL DATA:  Confusion  EXAM: PORTABLE CHEST - 1 VIEW  COMPARISON:  05/12/2008  FINDINGS: Cardiomediastinal silhouette is stable. No acute infiltrate or pleural effusion. No pulmonary edema. Extensive degenerative changes left shoulder.  IMPRESSION: No active disease.  Extensive degenerative changes left shoulder.   Electronically Signed   By: Natasha Mead M.D.   On: 10/31/2014 08:36    Assessment and plan discussed with with  attending physician and they are in agreement.    Felicie Morn PA-C [facility] [phone]  10/31/2014, 9:40 AM   Assessment: 81 y.o. male presenting to hospital after nursing home staff noted increased speech difficulty and possible left sided weakness for >24 hours. Currently patient shows no significant left sided weakness but cannot rule out possible stroke with history prior stroke and risk factors.   Stroke Risk Factors - hyperlipidemia and hypertension   Recommend: 1) MRI brain without contrast-if negative no further stroke work up warranted.  2) SLP evaluation   Patient seen and examined together with physician assistant and I concur with the assessment and plan.  Wyatt Portela, MD

## 2014-10-31 NOTE — ED Notes (Signed)
Hospice [phone]. Call with disposition.

## 2014-10-31 NOTE — ED Provider Notes (Addendum)
CSN: [id number]     Arrival date & time 10/31/14  0757 History   First MD Initiated Contact with Patient 10/31/14 0757     Chief Complaint  Patient presents with  . Altered Mental Status     (Consider location/radiation/quality/duration/timing/severity/associated sxs/prior Treatment) The history is provided by the EMS personnel.  Bobby Boyd is a 81 y.o. male hx of stroke with R sided residual deficit, MI, here with AMS. He is from [facility]. He was noted to have slurred speech this morning as well as left-sided weakness. Last normal was sometime yesterday but they weren't sure exactly time. At baseline he has right-sided residual deficits. He was wheeled to the common room and has been persistently leaning on the left side. Patient denies any falls but was noted to have bruising on his face. Patient unable to give much history.   Level V caveat- AMS   Past Medical History  Diagnosis Date  . Stroke   . Anxiety   . GERD (gastroesophageal reflux disease)   . MI (myocardial infarction)   . Hypertension   . Hyperlipidemia    History reviewed. No pertinent past surgical history. No family history on file. History  Substance Use Topics  . Smoking status: Never Smoker   . Smokeless tobacco: Not on file  . Alcohol Use: No    Review of Systems  Neurological: Positive for weakness.  All other systems reviewed and are negative.     Allergies  Review of patient's allergies indicates no known allergies.  Home Medications   Prior to Admission medications   Medication Sig Start Date End Date Taking? Authorizing Provider  acetaminophen (TYLENOL) 500 MG tablet Take 500 mg by mouth every 4 (four) hours as needed for mild pain, fever or headache.    Yes Historical Provider, MD  alum & mag hydroxide-simeth (MAALOX/MYLANTA) 200-200-20 MG/5ML suspension Take 30 mLs by mouth 4 (four) times daily as needed for indigestion or heartburn.   Yes Historical Provider, MD   aspirin 81 MG chewable tablet Chew 81 mg by mouth at bedtime.    Yes Historical Provider, MD  carvedilol (COREG) 3.125 MG tablet Take 3.125 mg by mouth 2 (two) times daily with a meal.     Yes Historical Provider, MD  clopidogrel (PLAVIX) 75 MG tablet Take 75 mg by mouth daily.     Yes Historical Provider, MD  Cranberry 200 MG CAPS Take 200 mg by mouth 2 (two) times daily.   Yes Historical Provider, MD  docusate sodium (COLACE) 100 MG capsule Take 100 mg by mouth 2 (two) times daily.   Yes Historical Provider, MD  guaiFENesin (ROBITUSSIN) 100 MG/5ML liquid Take 200 mg by mouth every 6 (six) hours as needed for cough.   Yes Historical Provider, MD  loperamide (IMODIUM) 2 MG capsule Take 2 mg by mouth 4 (four) times daily as needed for diarrhea or loose stools.   Yes Historical Provider, MD  Magnesium Hydroxide (MILK OF MAGNESIA PO) Take 30 mLs by mouth at bedtime as needed. For constipation   Yes Historical Provider, MD  neomycin-bacitracin-polymyxin (NEOSPORIN) ointment Apply 1 application topically daily as needed for wound care. Apply to right and left elbow wounds and cover with dressing   Yes Historical Provider, MD  nitroGLYCERIN (NITROSTAT) 0.4 MG SL tablet Place 0.4 mg under the tongue every 5 (five) minutes as needed for chest pain.    Yes Historical Provider, MD  pantoprazole (PROTONIX) 40 MG tablet Take 40 mg by mouth  daily.     Yes Historical Provider, MD  polyethylene glycol (MIRALAX / GLYCOLAX) packet Take 17 g by mouth daily with breakfast.   Yes Historical Provider, MD  Tamsulosin HCl (FLOMAX) 0.4 MG CAPS Take 0.4 mg by mouth daily.    Yes Historical Provider, MD  white petrolatum (VASELINE) GEL Apply 1 application topically as needed (to red areas on buttocks with each incontinent change).   Yes Historical Provider, MD   BP 134/74 mmHg  Pulse 74  Temp(Src) 98.1 F (36.7 C) (Oral)  Resp 12  SpO2 100% Physical Exam  Constitutional:  Chronically ill, slurred speech   HENT:   Mouth/Throat: Oropharynx is clear and moist.  L supraorbital area with bruise, extra ocular movements intact   Eyes: Conjunctivae are normal. Pupils are equal, round, and reactive to light.  Neck: Normal range of motion. Neck supple.  Cardiovascular: Normal rate, regular rhythm and normal heart sounds.   Pulmonary/Chest: Effort normal and breath sounds normal. No respiratory distress. He has no wheezes. He has no rales.  Abdominal: Soft. Bowel sounds are normal. He exhibits no distension. There is no tenderness. There is no guarding.  Musculoskeletal: Normal range of motion.  Neurological:  Confused. Contracted on R side. + L facial droop. Strength 3/5 R side, 4/5 L side.   Skin: Skin is warm and dry.  Psychiatric:  Unable   Nursing note and vitals reviewed.   ED Course  Procedures (including critical care time) Labs Review Labs Reviewed  CBC WITH DIFFERENTIAL/PLATELET - Abnormal; Notable for the following:    RBC 4.12 (*)    Hemoglobin 12.8 (*)    Neutrophils Relative % 79 (*)    Lymphocytes Relative 10 (*)    All other components within normal limits  COMPREHENSIVE METABOLIC PANEL - Abnormal; Notable for the following:    Glucose, Bld 113 (*)    BUN 31 (*)    Creatinine, Ser 1.54 (*)    Albumin 3.2 (*)    GFR calc non Af Amer 37 (*)    GFR calc Af Amer 43 (*)    All other components within normal limits  URINALYSIS, ROUTINE W REFLEX MICROSCOPIC - Abnormal; Notable for the following:    Hgb urine dipstick MODERATE (*)    All other components within normal limits  URINE MICROSCOPIC-ADD ON - Abnormal; Notable for the following:    Casts HYALINE CASTS (*)    All other components within normal limits  URINE CULTURE  I-STAT TROPOININ, ED    Imaging Review Ct Head Wo Contrast  10/31/2014   CLINICAL DATA:  Altered mental status  EXAM: CT HEAD WITHOUT CONTRAST  CT CERVICAL SPINE WITHOUT CONTRAST  TECHNIQUE: Multidetector CT imaging of the head and cervical spine was performed  following the standard protocol without intravenous contrast. Multiplanar CT image reconstructions of the cervical spine were also generated.  COMPARISON:  09/19/2014  FINDINGS: CT HEAD FINDINGS  No skull fracture is noted. Atherosclerotic calcifications of carotid siphon again noted. Paranasal sinuses and mastoid air cells are unremarkable. Stable cerebral atrophy. Again noted periventricular and subcortical chronic white matter disease. Old infarct in left parietal lobe is stable. No definite acute cortical infarction. No mass lesion is noted on this unenhanced scan.  CT CERVICAL SPINE FINDINGS  Axial images of the cervical spine shows no acute fracture or subluxation. Computer processed images shows degenerative changes C1-C2 articulation. Again noted about 2.4 mm anterolisthesis C4 on C5. Stable about 1.6 mm anterolisthesis C5 on C6 vertebral body.  There is disc space flattening with vacuum disc phenomenon at C4-C5-C5-C6 and C6-C7 level. Mild disc space flattening at C7-T1. Mild anterior and mild posterior spurring at C6-C7 level. No prevertebral soft tissue swelling. Cervical airway is patent.  IMPRESSION: 1. No acute intracranial abnormality. Stable atrophy and chronic white matter disease. 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 2. No cervical spine acute fracture or subluxation. Stable mild anterolisthesis C4 on C5 vertebral body and C5 on C6 vertebral body. Stable degenerative changes as described above.   Electronically Signed   By: Natasha Mead M.D.   On: 10/31/2014 09:05   Ct Cervical Spine Wo Contrast  10/31/2014   CLINICAL DATA:  Altered mental status  EXAM: CT HEAD WITHOUT CONTRAST  CT CERVICAL SPINE WITHOUT CONTRAST  TECHNIQUE: Multidetector CT imaging of the head and cervical spine was performed following the standard protocol without intravenous contrast. Multiplanar CT image reconstructions of the cervical spine were also generated.  COMPARISON:  09/19/2014  FINDINGS: CT HEAD FINDINGS  No skull  fracture is noted. Atherosclerotic calcifications of carotid siphon again noted. Paranasal sinuses and mastoid air cells are unremarkable. Stable cerebral atrophy. Again noted periventricular and subcortical chronic white matter disease. Old infarct in left parietal lobe is stable. No definite acute cortical infarction. No mass lesion is noted on this unenhanced scan.  CT CERVICAL SPINE FINDINGS  Axial images of the cervical spine shows no acute fracture or subluxation. Computer processed images shows degenerative changes C1-C2 articulation. Again noted about 2.4 mm anterolisthesis C4 on C5. Stable about 1.6 mm anterolisthesis C5 on C6 vertebral body. There is disc space flattening with vacuum disc phenomenon at C4-C5-C5-C6 and C6-C7 level. Mild disc space flattening at C7-T1. Mild anterior and mild posterior spurring at C6-C7 level. No prevertebral soft tissue swelling. Cervical airway is patent.  IMPRESSION: 1. No acute intracranial abnormality. Stable atrophy and chronic white matter disease. 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 2. No cervical spine acute fracture or subluxation. Stable mild anterolisthesis C4 on C5 vertebral body and C5 on C6 vertebral body. Stable degenerative changes as described above.   Electronically Signed   By: Natasha Mead M.D.   On: 10/31/2014 09:05   Dg Chest Port 1 View  10/31/2014   CLINICAL DATA:  Confusion  EXAM: PORTABLE CHEST - 1 VIEW  COMPARISON:  05/12/2008  FINDINGS: Cardiomediastinal silhouette is stable. No acute infiltrate or pleural effusion. No pulmonary edema. Extensive degenerative changes left shoulder.  IMPRESSION: No active disease.  Extensive degenerative changes left shoulder.   Electronically Signed   By: Natasha Mead M.D.   On: 10/31/2014 08:36     EKG Interpretation   Date/Time:  Monday October 31 2014 09:55:50 EST Ventricular Rate:  65 PR Interval:  199 QRS Duration: 146 QT Interval:  494 QTC Calculation: 514 R Axis:   -47 Text Interpretation:   Sinus rhythm Left bundle branch block complete heart  block from previous EKG likely from baseline tremors. Not in heart block  on this EKG  Reconfirmed by [facility]  MD, DAVID ([id number]) on 10/31/2014 10:02:27 AM      MDM   Final diagnoses:  Confusion   Bobby Boyd is a 81 y.o. male here with weakness. Likely stroke but outside window for TPA. Will do stroke workup and need admission.   12:35 PM Patient's son came to the ED. He is DNR/DNI. Back to baseline. Son said that he often leans to the left. He wants him on hospice. Neuro saw patient  and recommend MRI. However, son wants to take him back without further workup. He is already on plavix. Discussed with Dr. Cyril Mourningamillo again and he felt that given patient's age and comorbidities, can dc back to facility. I did talk to hospice and social work.   12:58 PM Patient initially failed swallow eval. However, at bedside, he is able to drink water without chocking. I wanted speech to evaluate but son doesn't want further workup and wants him back to facility. Understand risk of aspiration. Will dc back.   Richardean Canalavid H Yao, MD 10/31/14 1237  Richardean Canalavid H Yao, MD 10/31/14 [phone]

## 2014-10-31 NOTE — ED Notes (Signed)
Spoke with Tacey RuizLeah, Speech Therapy. Reports pt swallow eval can be done within the hour. MD made aware.

## 2014-10-31 NOTE — ED Notes (Signed)
MRI reports another 30 mins before MRI will be done. MD made aware.

## 2014-10-31 NOTE — Progress Notes (Signed)
LCSW was consulted for case. Patient unable to provide any information due to speech problems and no family at bedside. No formal assessment completed at this time. LCSW located patient family (son: Ron) and also spoke with his nurse at the ALF [facility]). Per ALF, patient is totally dependent at ALF and wheel chair bound. They report they do everything for patient and report they could take him back, but needing a new diet if patient continues to fail his swallow evaluation.  LCSW made facility aware of MRI pending and r/o of Stroke. Son aware of patient in hospital as well. Patient has community hospice, with [facility] Sil[facility] being the worker on call for the weekend as well as Karena Addison being weekday Insurance underwriter for [employer].  Unclear if patient needs a higher level of care at this time. (?SNF) Patient could not be placed from the ED due to insurance being Medicare and right now patient is not planning to be admitted unless Stroke workup is needed (thus pending MRI) Patient needs a three day qualifying stay or a previous 3 day qualifying stay in the last 30 days to be eligible for SNF from the ED which at this time he does not have.  Met with Hospice SW and LPN. Aware of situation and not discharging him from hospice at this time unless he is admitted.  Son is aware of plan, awaiting MRI results and follow up.  Lane Hacker, MSW Clinical Social Work: Emergency Room [phone]

## 2014-10-31 NOTE — ED Notes (Signed)
DR. Cyril Mourningamillo at the bedside.

## 2014-10-31 NOTE — ED Notes (Signed)
Patient transported to CT 

## 2014-10-31 NOTE — ED Notes (Signed)
Per [facility], [facility]. Pt is usually able to talk and answer questions. Speech is usually not 100 % clear but is clear enough you can understand the words. Staff called for a sick call. Pt was sitting in the wheelchair in the common room and leaning to the left side. Staff noticed he was leaning to the left during second shift yesterday and speech wasn't normal. Does have deficits from right side CVA in past. Is on plavix and has bruises to left forehead and left forearm. 18g to LAC. Pt has delayed speech.

## 2014-10-31 NOTE — Progress Notes (Signed)
Patient to be discharged back to ALF due to patient being at baseline per son. MD agreeable, patient not meeting criteria for SNF.  Hospice RNs called, patient no discharged from hospice. They will pick him up at facility. LCSW called [facility], made them aware of patient returning to ALF.  Deretha EmoryHannah Smrithi Pigford LCSW, MSW Clinical Social Work: Emergency Room [phone]

## 2014-10-31 NOTE — Discharge Instructions (Signed)
Continue current medications.   Follow up with your doctor.   Return to ER if he has worsening weakness, trouble speaking.

## 2014-10-31 NOTE — ED Notes (Signed)
Pts family arrived, states pt is at his baseline and that he always leans to the left in his wheelchair.

## 2014-11-01 LAB — URINE CULTURE
CULTURE: NO GROWTH
Colony Count: NO GROWTH

## 2015-03-12 ENCOUNTER — Emergency Department (HOSPITAL_COMMUNITY)
Admission: EM | Admit: 2015-03-12 | Discharge: 2015-03-12 | Disposition: A | Discharge status: Home / Self Care | Payer: Medicare Other | Attending: Emergency Medicine | Admitting: Emergency Medicine

## 2015-03-12 ENCOUNTER — Encounter (HOSPITAL_COMMUNITY): Payer: Self-pay | Admitting: *Deleted

## 2015-03-12 ENCOUNTER — Emergency Department (HOSPITAL_COMMUNITY): Payer: Medicare Other

## 2015-03-12 DIAGNOSIS — S0990XA Unspecified injury of head, initial encounter: Secondary | ICD-10-CM | POA: Insufficient documentation

## 2015-03-12 DIAGNOSIS — F0390 Unspecified dementia without behavioral disturbance: Secondary | ICD-10-CM | POA: Insufficient documentation

## 2015-03-12 DIAGNOSIS — I10 Essential (primary) hypertension: Secondary | ICD-10-CM | POA: Diagnosis not present

## 2015-03-12 DIAGNOSIS — Y998 Other external cause status: Secondary | ICD-10-CM | POA: Insufficient documentation

## 2015-03-12 DIAGNOSIS — K219 Gastro-esophageal reflux disease without esophagitis: Secondary | ICD-10-CM | POA: Insufficient documentation

## 2015-03-12 DIAGNOSIS — Z8639 Personal history of other endocrine, nutritional and metabolic disease: Secondary | ICD-10-CM | POA: Insufficient documentation

## 2015-03-12 DIAGNOSIS — Z7982 Long term (current) use of aspirin: Secondary | ICD-10-CM | POA: Insufficient documentation

## 2015-03-12 DIAGNOSIS — Z79899 Other long term (current) drug therapy: Secondary | ICD-10-CM | POA: Diagnosis not present

## 2015-03-12 DIAGNOSIS — W19XXXA Unspecified fall, initial encounter: Secondary | ICD-10-CM

## 2015-03-12 DIAGNOSIS — W050XXA Fall from non-moving wheelchair, initial encounter: Secondary | ICD-10-CM | POA: Diagnosis not present

## 2015-03-12 DIAGNOSIS — Y9389 Activity, other specified: Secondary | ICD-10-CM | POA: Diagnosis not present

## 2015-03-12 DIAGNOSIS — Z8673 Personal history of transient ischemic attack (TIA), and cerebral infarction without residual deficits: Secondary | ICD-10-CM | POA: Insufficient documentation

## 2015-03-12 DIAGNOSIS — I252 Old myocardial infarction: Secondary | ICD-10-CM | POA: Diagnosis not present

## 2015-03-12 DIAGNOSIS — Y9289 Other specified places as the place of occurrence of the external cause: Secondary | ICD-10-CM | POA: Insufficient documentation

## 2015-03-12 NOTE — ED Provider Notes (Signed)
CSN: [id number]     Arrival date & time 03/12/15  1353 History   First MD Initiated Contact with Patient 03/12/15 1502     Chief Complaint  Patient presents with  . Fall     (Consider location/radiation/quality/duration/timing/severity/associated sxs/prior Treatment) HPI Patient presents to the emergency department following a fall that occurred just prior to arrival.  The patient did not have a witnessed fall staff states that he appeared to slide out of his wheelchair.  The patient is unable to give me any history as he has dementia.  There is some bruising noted above the left eye, which does not appear to be fresh, but it is hard to determine the age Past Medical History  Diagnosis Date  . Stroke   . Anxiety   . GERD (gastroesophageal reflux disease)   . MI (myocardial infarction)   . Hypertension   . Hyperlipidemia    History reviewed. No pertinent past surgical history. History reviewed. No pertinent family history. History  Substance Use Topics  . Smoking status: Never Smoker   . Smokeless tobacco: Not on file  . Alcohol Use: No    Review of Systems  Level V caveat applies due to dementia  Allergies  Review of patient's allergies indicates no known allergies.  Home Medications   Prior to Admission medications   Medication Sig Start Date End Date Taking? Authorizing Provider  alum & mag hydroxide-simeth (MAALOX/MYLANTA) 200-200-20 MG/5ML suspension Take 30 mLs by mouth 4 (four) times daily as needed for indigestion or heartburn.   Yes Historical Provider, MD  aspirin 81 MG chewable tablet Chew 81 mg by mouth at bedtime.    Yes Historical Provider, MD  carvedilol (COREG) 3.125 MG tablet Take 3.125 mg by mouth 2 (two) times daily with a meal.     Yes Historical Provider, MD  clopidogrel (PLAVIX) 75 MG tablet Take 75 mg by mouth daily.     Yes Historical Provider, MD  Cranberry 200 MG CAPS Take 200 mg by mouth 2 (two) times daily.   Yes Historical Provider, MD    docusate sodium (COLACE) 100 MG capsule Take 100 mg by mouth 2 (two) times daily.   Yes Historical Provider, MD  ENSURE (ENSURE) Take 237 mLs by mouth 3 (three) times daily between meals.   Yes Historical Provider, MD  guaiFENesin (ROBITUSSIN) 100 MG/5ML liquid Take 200 mg by mouth every 6 (six) hours as needed for cough.   Yes Historical Provider, MD  loperamide (IMODIUM) 2 MG capsule Take 2 mg by mouth 4 (four) times daily as needed for diarrhea or loose stools.   Yes Historical Provider, MD  magnesium hydroxide (MILK OF MAGNESIA) 400 MG/5ML suspension Take 30 mLs by mouth at bedtime as needed for mild constipation.   Yes Historical Provider, MD  neomycin-bacitracin-polymyxin (NEOSPORIN) ointment Apply 1 application topically daily as needed for wound care. Apply to right and left elbow wounds and cover with dressing   Yes Historical Provider, MD  nitroGLYCERIN (NITROSTAT) 0.4 MG SL tablet Place 0.4 mg under the tongue every 5 (five) minutes as needed for chest pain.    Yes Historical Provider, MD  pantoprazole (PROTONIX) 40 MG tablet Take 40 mg by mouth every morning.    Yes Historical Provider, MD  polyethylene glycol (MIRALAX / GLYCOLAX) packet Take 17 g by mouth daily with breakfast.   Yes Historical Provider, MD  Tamsulosin HCl (FLOMAX) 0.4 MG CAPS Take 0.4 mg by mouth every morning.    Yes Historical Provider,  MD  white petrolatum (VASELINE) GEL Apply 1 application topically as needed (to red areas on buttocks with each incontinent change).   Yes Historical Provider, MD  acetaminophen (TYLENOL) 500 MG tablet Take 500 mg by mouth every 4 (four) hours as needed for mild pain, fever or headache (any fever above 101, call doctor).     Historical Provider, MD   BP 143/71 mmHg  Pulse 59  Temp(Src) 98.2 F (36.8 C) (Oral)  Resp 16  SpO2 99% Physical Exam  Constitutional: He appears well-developed and well-nourished. No distress.  HENT:  Head: Normocephalic.    Mouth/Throat: Oropharynx is  clear and moist.  Eyes: Pupils are equal, round, and reactive to light.  Neck: Normal range of motion. Neck supple.  Cardiovascular: Normal rate, regular rhythm and normal heart sounds.  Exam reveals no gallop and no friction rub.   No murmur heard. Pulmonary/Chest: Effort normal and breath sounds normal. No respiratory distress.  Neurological: He is alert. He exhibits normal muscle tone. Coordination normal.  Skin: Skin is warm and dry. No rash noted. No erythema.  Nursing note and vitals reviewed.   ED Course  Procedures (including critical care time) Labs Review Labs Reviewed - No data to display  Imaging Review Ct Head Wo Contrast  03/12/2015   CLINICAL DATA:  Confusion  EXAM: CT HEAD WITHOUT CONTRAST  CT CERVICAL SPINE WITHOUT CONTRAST  TECHNIQUE: Multidetector CT imaging of the head and cervical spine was performed following the standard protocol without intravenous contrast. Multiplanar CT image reconstructions of the cervical spine were also generated.  COMPARISON:  10/31/2014  FINDINGS: CT HEAD FINDINGS  The bony calvarium is intact. Diffuse atrophic changes are noted. Findings of prior left middle cerebral artery infarct are again seen and stable. No findings to suggest acute hemorrhage, acute infarction or space-occupying mass lesion are noted.  CT CERVICAL SPINE FINDINGS  Seven cervical segments are well visualized. Vertebral body height is well maintained. Multilevel facet hypertrophic changes and osteophytic changes are seen. The overall appearance is similar to that noted on the prior exam. No acute fracture or acute facet abnormality is noted. The visualized lung apices are within normal limits. The surrounding soft tissues show thickening of the proximal esophagus which may be related to the patient's given clinical history of esophageal reflux. Small thyroid nodules are noted not significant by size criteria.  IMPRESSION: CT of the head: Chronic atrophic and ischemic changes  without acute abnormality.  CT of the cervical spine: Multilevel degenerative changes without acute abnormality.   Electronically Signed   By: Alcide Clever M.D.   On: 03/12/2015 15:57   Ct Cervical Spine Wo Contrast  03/12/2015   CLINICAL DATA:  Confusion  EXAM: CT HEAD WITHOUT CONTRAST  CT CERVICAL SPINE WITHOUT CONTRAST  TECHNIQUE: Multidetector CT imaging of the head and cervical spine was performed following the standard protocol without intravenous contrast. Multiplanar CT image reconstructions of the cervical spine were also generated.  COMPARISON:  10/31/2014  FINDINGS: CT HEAD FINDINGS  The bony calvarium is intact. Diffuse atrophic changes are noted. Findings of prior left middle cerebral artery infarct are again seen and stable. No findings to suggest acute hemorrhage, acute infarction or space-occupying mass lesion are noted.  CT CERVICAL SPINE FINDINGS  Seven cervical segments are well visualized. Vertebral body height is well maintained. Multilevel facet hypertrophic changes and osteophytic changes are seen. The overall appearance is similar to that noted on the prior exam. No acute fracture or acute facet abnormality is  noted. The visualized lung apices are within normal limits. The surrounding soft tissues show thickening of the proximal esophagus which may be related to the patient's given clinical history of esophageal reflux. Small thyroid nodules are noted not significant by size criteria.  IMPRESSION: CT of the head: Chronic atrophic and ischemic changes without acute abnormality.  CT of the cervical spine: Multilevel degenerative changes without acute abnormality.   Electronically Signed   By: Alcide Clever M.D.   On: 03/12/2015 15:57    Patient is, negative CT scans.  We will have the patient follow up with primary care doctor.  There is no acute injuries noted on CT scan.  Told to return here as needed    Charlestine Night, PA-C 03/12/15 1700  Linwood Dibbles, MD 03/12/15 1704

## 2015-03-12 NOTE — ED Notes (Signed)
[facility]

## 2015-03-12 NOTE — ED Notes (Signed)
Family at bedside. 

## 2015-03-12 NOTE — ED Notes (Signed)
Pt fell from wheelchair  [facility]ives in [facility]

## 2015-03-12 NOTE — ED Notes (Signed)
Bed: WA02 Expected date:  Expected time:  Means of arrival:  Comments: EMS-81 yo fall, hospice pt

## 2015-03-12 NOTE — ED Notes (Signed)
Pt tearful and confused upon arrival by ems.  C-collar in place.  Pt has dementia/confused.

## 2015-03-12 NOTE — Discharge Instructions (Signed)
Return here as needed.  Follow-up with his primary care doctor

## 2015-03-12 NOTE — ED Notes (Signed)
MD at bedside. 

## 2015-07-27 DEATH — deceased

## 2016-02-02 IMAGING — CT CT CERVICAL SPINE W/O CM
4 of 5 series · 14 of 33 positions shown, 16 images · non-contrast
Comparison: 09/19/2014

CLINICAL DATA: Altered mental status

EXAM:
CT HEAD WITHOUT CONTRAST
CT CERVICAL SPINE WITHOUT CONTRAST
TECHNIQUE: Multidetector CT imaging of the head and cervical spine was
performed following the standard protocol without intravenous
contrast. Multiplanar CT image reconstructions of the cervical spine
were also generated.

[Series 5: c_spine 2.0 i40s 3 · axial · 0.36mm/px · z∈[-284,-170]mm · 4 of 91 slices shown, 5 images]
[im 19/91  soft-tissue]
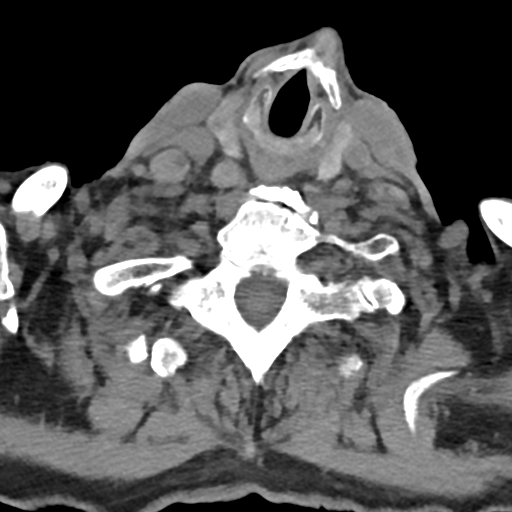
[im 19/91  bone]
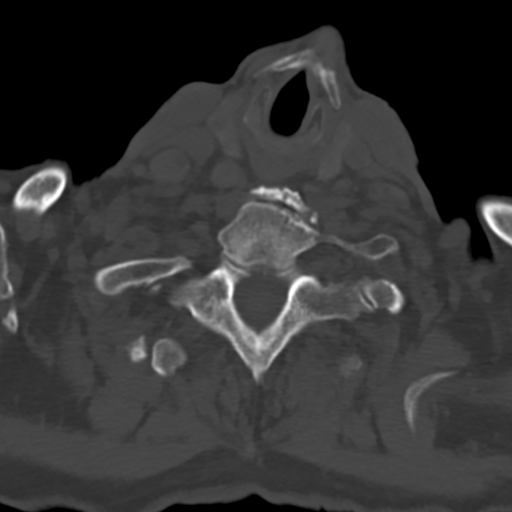
[im 37/91  bone]
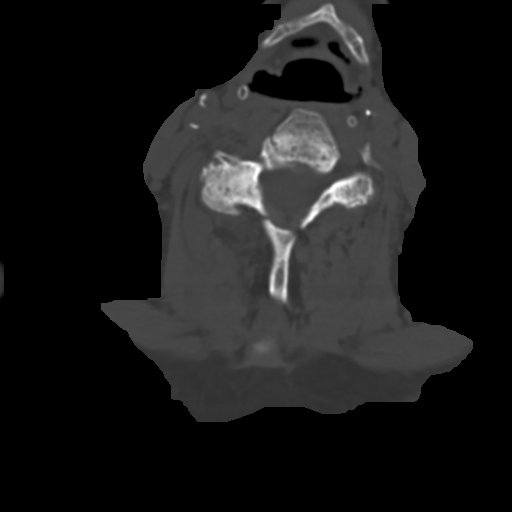
[im 55/91  bone]
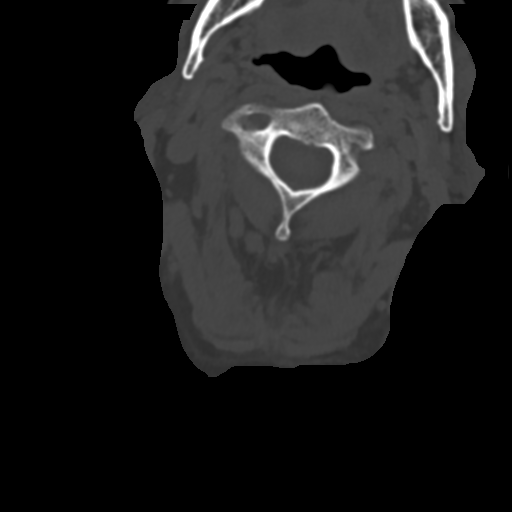
[im 73/91  bone]
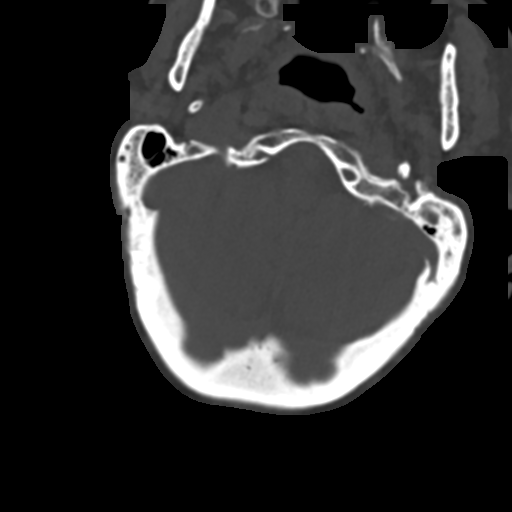

[Series 7: coronals · coronal · 0.27mm/px · 3 of 63 slices shown]
[im 13/63  bone]
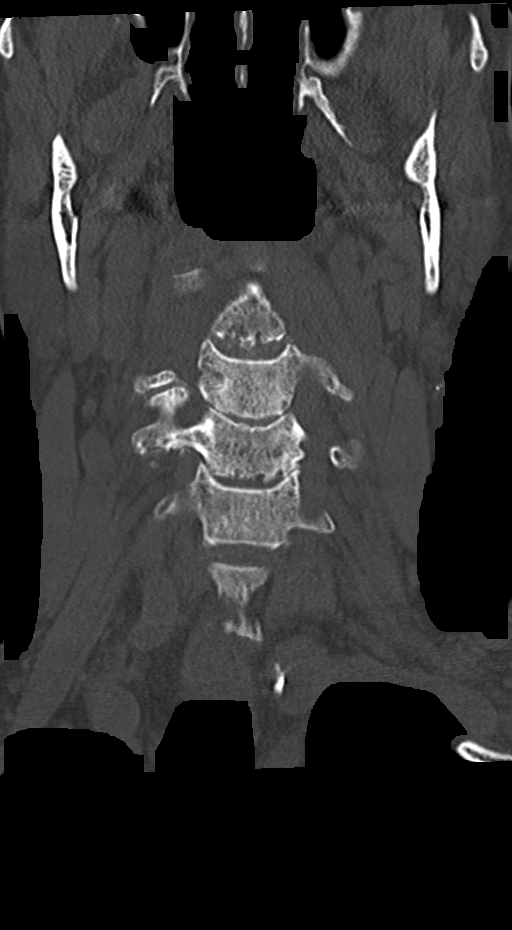
[im 25/63  bone]
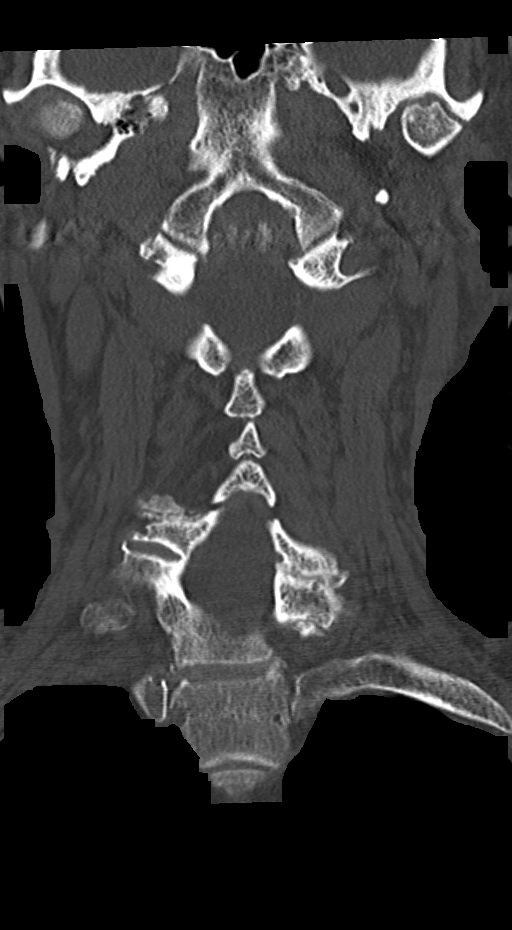
[im 38/63  bone]
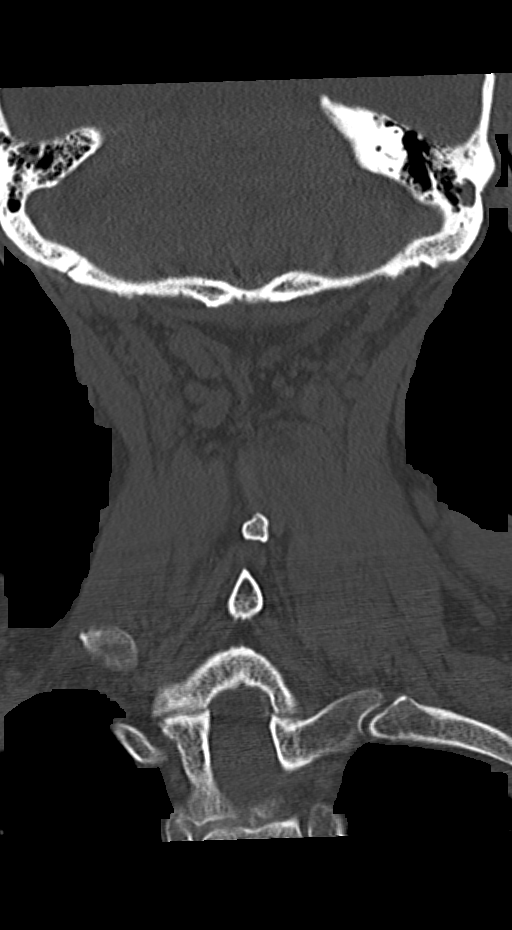

[Series 8: sagittals · sagittal · 0.39mm/px · 5 of 64 slices shown, 6 images]
[im 22/64  bone]
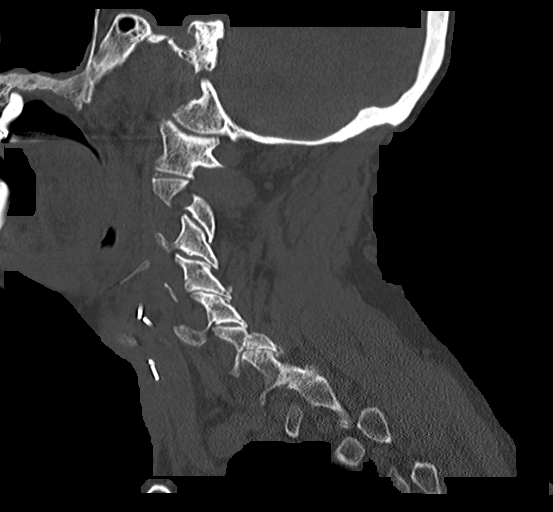
[im 27/64  bone]
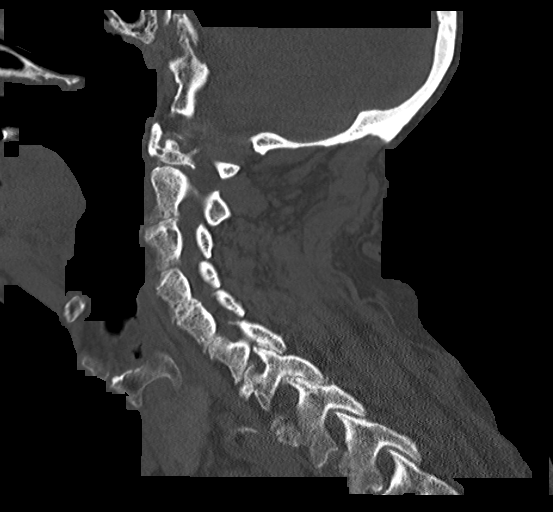
[im 32/64  soft-tissue]
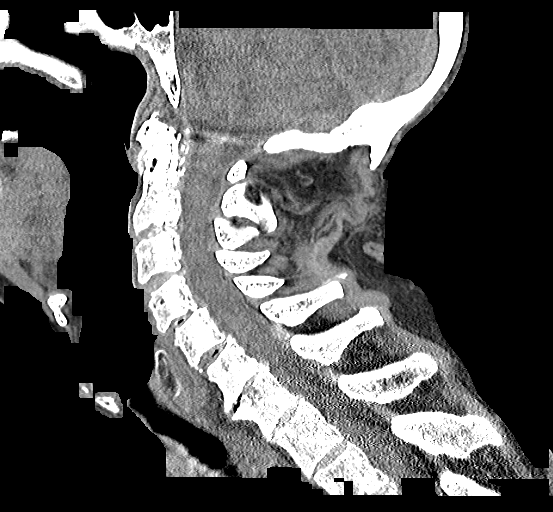
[im 32/64  bone]
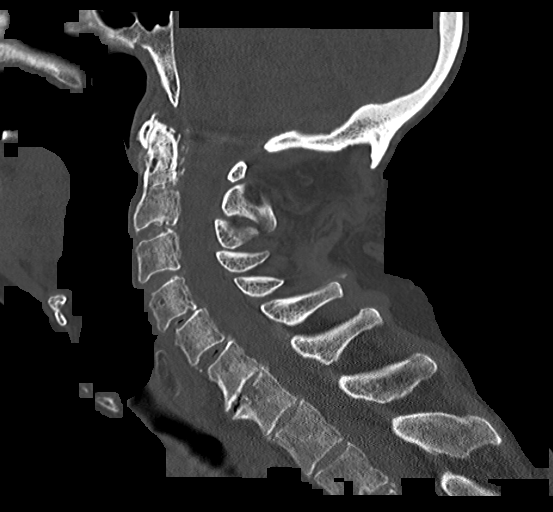
[im 37/64  bone]
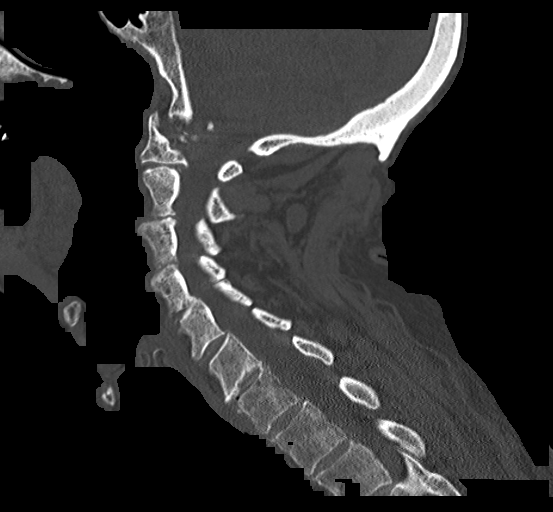
[im 43/64  bone]
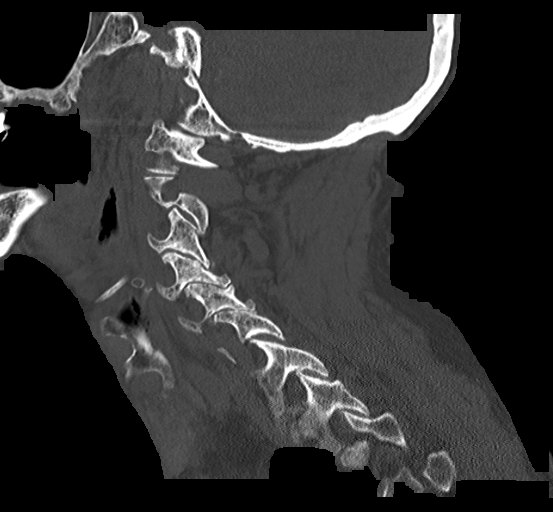

[Series 9: orthogonals · axial · 0.23mm/px · z∈[-309,-268]mm · 2 of 107 slices shown]
[im 22/107  bone]
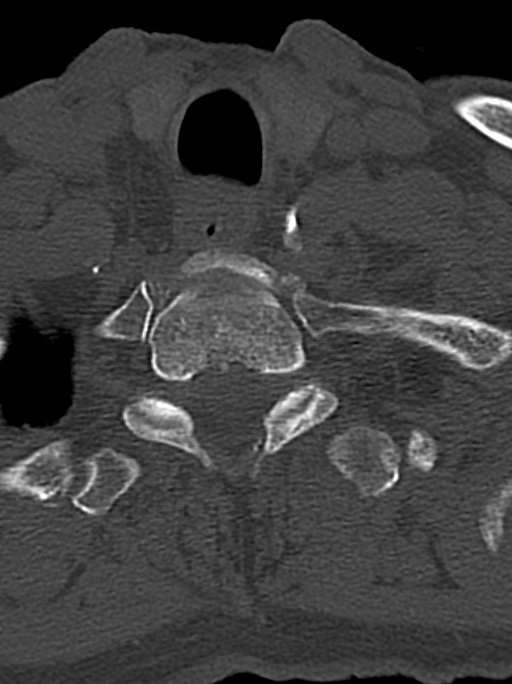
[im 43/107  bone]
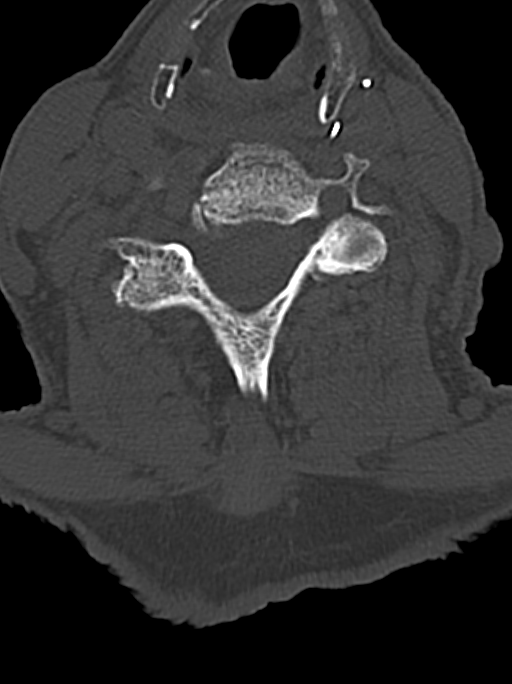

[14 of 33 positions shown; findings below may reference images not displayed]

FINDINGS: CT HEAD FINDINGS

No skull fracture is noted. Atherosclerotic calcifications of
carotid siphon again noted. Paranasal sinuses and mastoid air cells
are unremarkable. Stable cerebral atrophy. Again noted
periventricular and subcortical chronic white matter disease. Old
infarct in left parietal lobe is stable. No definite acute cortical
infarction. No mass lesion is noted on this unenhanced scan.

CT CERVICAL SPINE FINDINGS

Axial images of the cervical spine shows no acute fracture or
subluxation. Computer processed images shows degenerative changes
C1-C2 articulation. Again noted about 2.4 mm anterolisthesis C4 on
C5. Stable about 1.6 mm anterolisthesis C5 on C6 vertebral body.
There is disc space flattening with vacuum disc phenomenon at
C4-C5-C5-C6 and C6-C7 level. Mild disc space flattening at C7-T1.
Mild anterior and mild posterior spurring at C6-C7 level. No
prevertebral soft tissue swelling. Cervical airway is patent.
IMPRESSION: 1. No acute intracranial abnormality. Stable atrophy and chronic
white matter disease. Stable old infarct in left parietal lobe.
2. No cervical spine acute fracture or subluxation. Stable mild
anterolisthesis C4 on C5 vertebral body and C5 on C6 vertebral body.
Stable degenerative changes as described above.

## 2016-02-02 IMAGING — CR DG CHEST 1V PORT
1 series · 1 of 1 positions shown · non-contrast
Comparison: 05/12/2008

CLINICAL DATA: Confusion

EXAM:
PORTABLE CHEST - 1 VIEW

[ap]
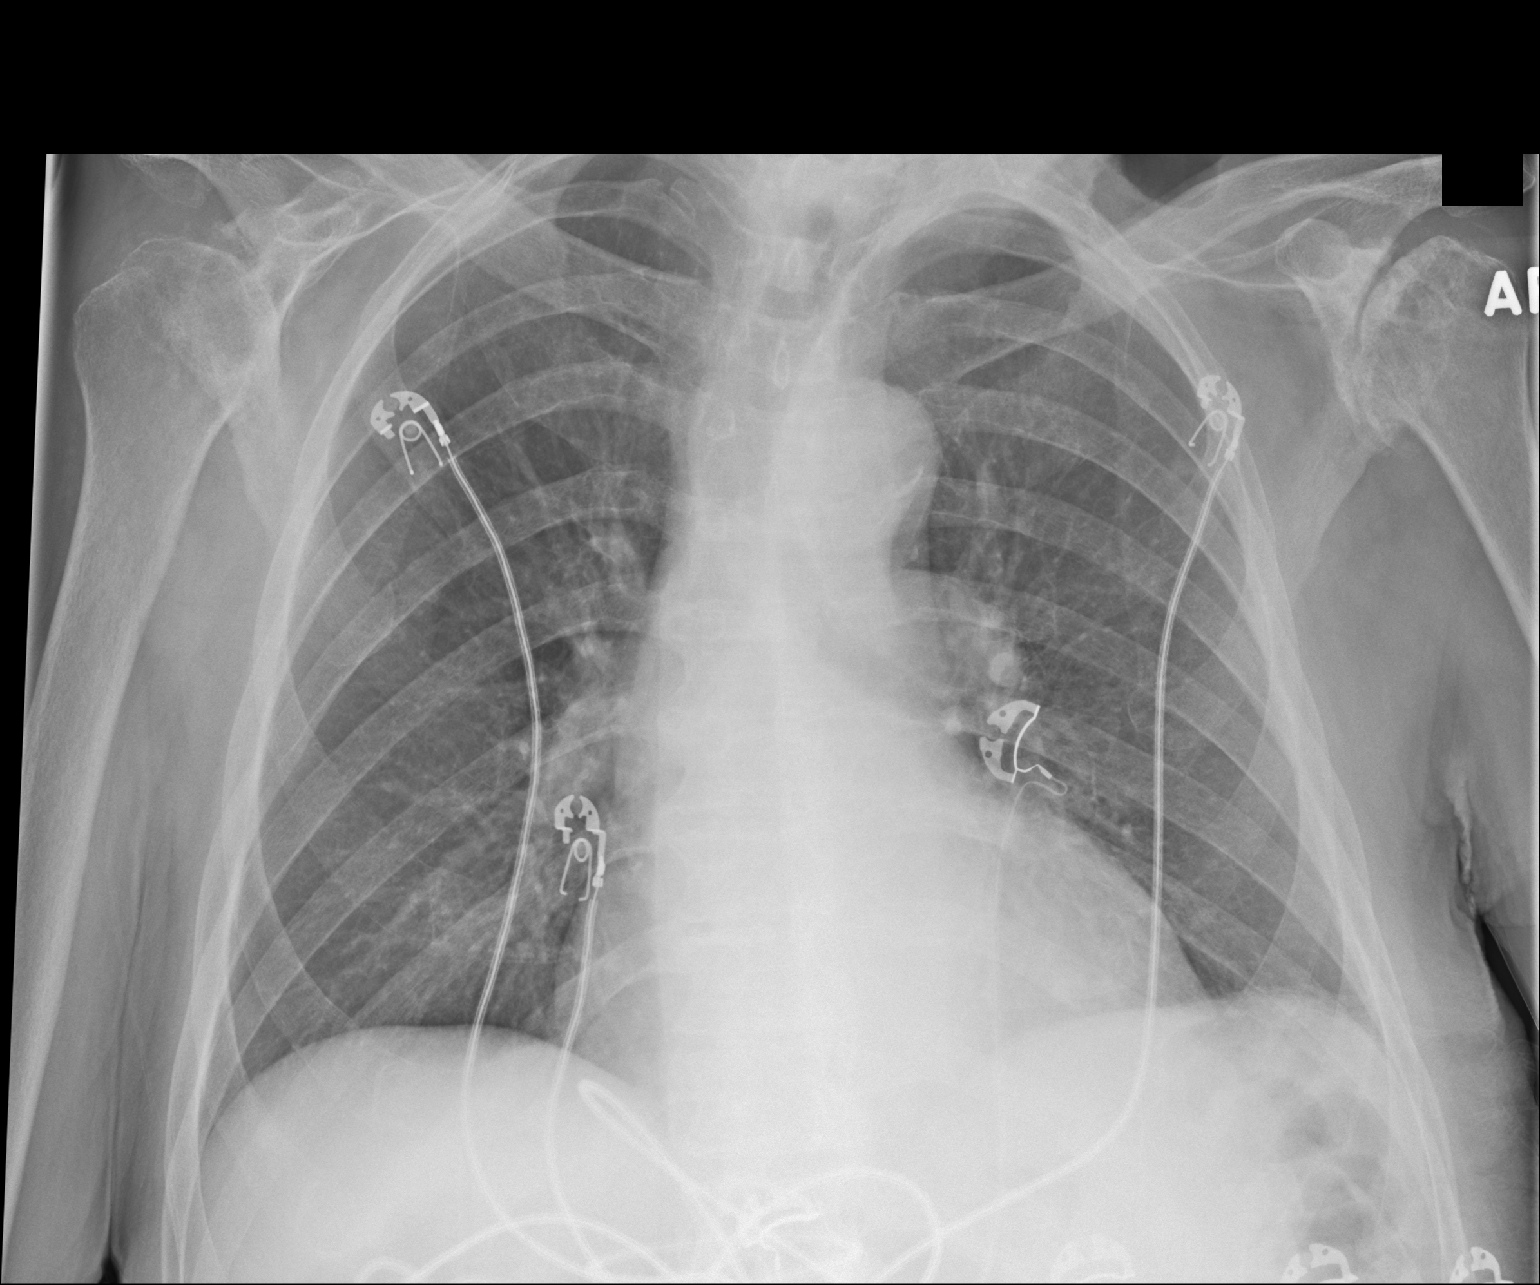

[1 of 1 positions shown; findings below may reference images not displayed]

FINDINGS: Cardiomediastinal silhouette is stable. No acute infiltrate or
pleural effusion. No pulmonary edema. Extensive degenerative changes
left shoulder.
IMPRESSION: No active disease.  Extensive degenerative changes left shoulder.
